# Patient Record
Sex: Female | Born: 1955 | Race: White | Hispanic: No | Marital: Married | Smoking: Former smoker
Health system: Southern US, Community
[De-identification: ages and names within clinical notes are randomized; demographics above are authoritative.]

## PROBLEM LIST (undated history)

## (undated) DIAGNOSIS — R7303 Prediabetes: Secondary | ICD-10-CM

## (undated) DIAGNOSIS — E785 Hyperlipidemia, unspecified: Secondary | ICD-10-CM

## (undated) DIAGNOSIS — M1990 Unspecified osteoarthritis, unspecified site: Secondary | ICD-10-CM

## (undated) DIAGNOSIS — E039 Hypothyroidism, unspecified: Secondary | ICD-10-CM

## (undated) DIAGNOSIS — I10 Essential (primary) hypertension: Secondary | ICD-10-CM

## (undated) DIAGNOSIS — N6320 Unspecified lump in the left breast, unspecified quadrant: Secondary | ICD-10-CM

## (undated) DIAGNOSIS — J449 Chronic obstructive pulmonary disease, unspecified: Secondary | ICD-10-CM

## (undated) HISTORY — PX: LUNG SURGERY: SHX703

## (undated) HISTORY — PX: CARPAL TUNNEL RELEASE: SHX101

---

## 2007-08-31 ENCOUNTER — Ambulatory Visit (HOSPITAL_COMMUNITY): Admission: RE | Admit: 2007-08-31 | Discharge: 2007-08-31 | Payer: Self-pay | Admitting: Internal Medicine

## 2007-09-17 ENCOUNTER — Ambulatory Visit: Payer: Self-pay | Admitting: Thoracic Surgery

## 2007-10-02 ENCOUNTER — Inpatient Hospital Stay (HOSPITAL_COMMUNITY): Admission: RE | Admit: 2007-10-02 | Discharge: 2007-10-06 | Payer: Self-pay | Admitting: Thoracic Surgery

## 2007-10-02 ENCOUNTER — Encounter: Payer: Self-pay | Admitting: Thoracic Surgery

## 2007-10-06 ENCOUNTER — Ambulatory Visit: Payer: Self-pay | Admitting: Thoracic Surgery

## 2007-10-14 ENCOUNTER — Encounter: Admission: RE | Admit: 2007-10-14 | Discharge: 2007-10-14 | Payer: Self-pay | Admitting: Thoracic Surgery

## 2007-10-14 ENCOUNTER — Ambulatory Visit: Payer: Self-pay | Admitting: Thoracic Surgery

## 2007-11-04 ENCOUNTER — Ambulatory Visit: Payer: Self-pay | Admitting: Thoracic Surgery

## 2007-11-04 ENCOUNTER — Encounter: Admission: RE | Admit: 2007-11-04 | Discharge: 2007-11-04 | Payer: Self-pay | Admitting: Thoracic Surgery

## 2007-11-18 ENCOUNTER — Encounter: Admission: RE | Admit: 2007-11-18 | Discharge: 2007-11-18 | Payer: Self-pay | Admitting: Thoracic Surgery

## 2007-11-18 ENCOUNTER — Ambulatory Visit: Payer: Self-pay | Admitting: Thoracic Surgery

## 2007-11-24 ENCOUNTER — Ambulatory Visit: Payer: Self-pay | Admitting: Cardiology

## 2008-01-12 ENCOUNTER — Encounter: Admission: RE | Admit: 2008-01-12 | Discharge: 2008-01-12 | Payer: Self-pay | Admitting: Thoracic Surgery

## 2008-01-12 ENCOUNTER — Ambulatory Visit: Payer: Self-pay | Admitting: Thoracic Surgery

## 2008-05-03 ENCOUNTER — Encounter: Admission: RE | Admit: 2008-05-03 | Discharge: 2008-05-03 | Payer: Self-pay | Admitting: Thoracic Surgery

## 2008-05-03 ENCOUNTER — Ambulatory Visit: Payer: Self-pay | Admitting: Thoracic Surgery

## 2008-11-08 ENCOUNTER — Ambulatory Visit: Payer: Self-pay | Admitting: Thoracic Surgery

## 2008-11-11 ENCOUNTER — Encounter: Payer: Self-pay | Admitting: Thoracic Surgery

## 2008-11-11 ENCOUNTER — Inpatient Hospital Stay (HOSPITAL_COMMUNITY): Admission: RE | Admit: 2008-11-11 | Discharge: 2008-11-17 | Payer: Self-pay | Admitting: Thoracic Surgery

## 2008-11-11 ENCOUNTER — Ambulatory Visit: Payer: Self-pay | Admitting: Infectious Diseases

## 2008-11-11 ENCOUNTER — Ambulatory Visit: Payer: Self-pay | Admitting: Thoracic Surgery

## 2008-11-24 ENCOUNTER — Encounter: Admission: RE | Admit: 2008-11-24 | Discharge: 2008-11-24 | Payer: Self-pay | Admitting: Thoracic Surgery

## 2008-11-24 ENCOUNTER — Ambulatory Visit: Payer: Self-pay | Admitting: Thoracic Surgery

## 2008-12-14 ENCOUNTER — Encounter: Admission: RE | Admit: 2008-12-14 | Discharge: 2008-12-14 | Payer: Self-pay | Admitting: Thoracic Surgery

## 2008-12-14 ENCOUNTER — Ambulatory Visit: Payer: Self-pay | Admitting: Thoracic Surgery

## 2009-02-21 ENCOUNTER — Ambulatory Visit: Payer: Self-pay | Admitting: Thoracic Surgery

## 2009-02-21 ENCOUNTER — Encounter: Admission: RE | Admit: 2009-02-21 | Discharge: 2009-02-21 | Payer: Self-pay | Admitting: Thoracic Surgery

## 2010-11-18 ENCOUNTER — Encounter: Payer: Self-pay | Admitting: Thoracic Surgery

## 2011-02-11 LAB — GLUCOSE, CAPILLARY
Glucose-Capillary: 102 mg/dL — ABNORMAL HIGH (ref 70–99)
Glucose-Capillary: 120 mg/dL — ABNORMAL HIGH (ref 70–99)
Glucose-Capillary: 129 mg/dL — ABNORMAL HIGH (ref 70–99)
Glucose-Capillary: 225 mg/dL — ABNORMAL HIGH (ref 70–99)
Glucose-Capillary: 85 mg/dL (ref 70–99)
Glucose-Capillary: 98 mg/dL (ref 70–99)
Glucose-Capillary: 99 mg/dL (ref 70–99)

## 2011-02-11 LAB — LACTATE DEHYDROGENASE, PLEURAL OR PERITONEAL FLUID: LD, Fluid: 3566 U/L — ABNORMAL HIGH (ref 3–23)

## 2011-02-11 LAB — BODY FLUID CELL COUNT WITH DIFFERENTIAL

## 2011-02-11 LAB — CBC
HCT: 26.8 % — ABNORMAL LOW (ref 36.0–46.0)
HCT: 29.1 % — ABNORMAL LOW (ref 36.0–46.0)
Hemoglobin: 10.1 g/dL — ABNORMAL LOW (ref 12.0–15.0)
Hemoglobin: 9.6 g/dL — ABNORMAL LOW (ref 12.0–15.0)
MCHC: 32.7 g/dL (ref 30.0–36.0)
MCHC: 32.6 g/dL (ref 30.0–36.0)
MCHC: 32.6 g/dL (ref 30.0–36.0)
MCHC: 32.9 g/dL (ref 30.0–36.0)
MCHC: 32.9 g/dL (ref 30.0–36.0)
MCV: 85.6 fL (ref 78.0–100.0)
Platelets: 254 10*3/uL (ref 150–400)
Platelets: 267 10*3/uL (ref 150–400)
Platelets: 287 10*3/uL (ref 150–400)
Platelets: 292 10*3/uL (ref 150–400)
RBC: 4.16 MIL/uL (ref 3.87–5.11)
RBC: 3.08 MIL/uL — ABNORMAL LOW (ref 3.87–5.11)
RBC: 3.38 MIL/uL — ABNORMAL LOW (ref 3.87–5.11)
RBC: 3.51 MIL/uL — ABNORMAL LOW (ref 3.87–5.11)
RDW: 15.3 % (ref 11.5–15.5)
RDW: 15.9 % — ABNORMAL HIGH (ref 11.5–15.5)
RDW: 15.9 % — ABNORMAL HIGH (ref 11.5–15.5)
RDW: 16.1 % — ABNORMAL HIGH (ref 11.5–15.5)
WBC: 11.6 10*3/uL — ABNORMAL HIGH (ref 4.0–10.5)
WBC: 4.9 10*3/uL (ref 4.0–10.5)
WBC: 7 10*3/uL (ref 4.0–10.5)
WBC: 8.3 10*3/uL (ref 4.0–10.5)

## 2011-02-11 LAB — BASIC METABOLIC PANEL
BUN: 4 mg/dL — ABNORMAL LOW (ref 6–23)
CO2: 27 mEq/L (ref 19–32)
CO2: 28 mEq/L (ref 19–32)
CO2: 29 mEq/L (ref 19–32)
Calcium: 9.3 mg/dL (ref 8.4–10.5)
Calcium: 7.6 mg/dL — ABNORMAL LOW (ref 8.4–10.5)
Calcium: 7.8 mg/dL — ABNORMAL LOW (ref 8.4–10.5)
Calcium: 8.3 mg/dL — ABNORMAL LOW (ref 8.4–10.5)
Calcium: 8.5 mg/dL (ref 8.4–10.5)
Chloride: 102 mEq/L (ref 96–112)
Chloride: 97 mEq/L (ref 96–112)
Creatinine, Ser: 0.49 mg/dL (ref 0.4–1.2)
Creatinine, Ser: 0.51 mg/dL (ref 0.4–1.2)
GFR calc Af Amer: >60 mL/min (ref >60)
GFR calc Af Amer: >60 mL/min (ref >60)
GFR calc Af Amer: >60 mL/min (ref >60)
GFR calc Af Amer: >60 mL/min (ref >60)
GFR calc non Af Amer: >60 mL/min (ref >60)
GFR calc non Af Amer: >60 mL/min (ref >60)
GFR calc non Af Amer: >60 mL/min (ref >60)
Glucose, Bld: 103 mg/dL — ABNORMAL HIGH (ref 70–99)
Glucose, Bld: 83 mg/dL (ref 70–99)
Glucose, Bld: 85 mg/dL (ref 70–99)
Potassium: 3.4 mEq/L — ABNORMAL LOW (ref 3.5–5.1)
Potassium: 3.6 mEq/L (ref 3.5–5.1)
Potassium: 4.2 mEq/L (ref 3.5–5.1)
Sodium: 132 mEq/L — ABNORMAL LOW (ref 135–145)
Sodium: 133 mEq/L — ABNORMAL LOW (ref 135–145)
Sodium: 136 mEq/L (ref 135–145)
Sodium: 138 mEq/L (ref 135–145)
Sodium: 139 mEq/L (ref 135–145)

## 2011-02-11 LAB — COMPREHENSIVE METABOLIC PANEL
ALT: 13 U/L (ref 0–35)
AST: 14 U/L (ref 0–37)
Calcium: 7.9 mg/dL — ABNORMAL LOW (ref 8.4–10.5)
Creatinine, Ser: 0.63 mg/dL (ref 0.4–1.2)
GFR calc Af Amer: >60 mL/min (ref >60)
Sodium: 132 mEq/L — ABNORMAL LOW (ref 135–145)
Total Protein: 5.7 g/dL — ABNORMAL LOW (ref 6.0–8.3)

## 2011-02-11 LAB — POCT I-STAT 3, ART BLOOD GAS (G3+)
Bicarbonate: 25.2 mEq/L — ABNORMAL HIGH (ref 20.0–24.0)
O2 Saturation: 92 %
TCO2: 26 mmol/L (ref 0–100)
pCO2 arterial: 41.5 mmHg (ref 35.0–45.0)
pO2, Arterial: 67 mmHg — ABNORMAL LOW (ref 80.0–100.0)

## 2011-02-11 LAB — BLOOD GAS, ARTERIAL
Drawn by: 206361
FIO2: 0.21 %
pCO2 arterial: 35 mmHg (ref 35.0–45.0)
pH, Arterial: 7.452 — ABNORMAL HIGH (ref 7.350–7.400)

## 2011-02-11 LAB — TISSUE CULTURE: Culture: NO GROWTH

## 2011-02-11 LAB — PROTIME-INR
INR: 1 (ref 0.00–1.49)
Prothrombin Time: 13.8 seconds (ref 11.6–15.2)

## 2011-02-11 LAB — AFB CULTURE WITH SMEAR (NOT AT ARMC)

## 2011-02-11 LAB — URINALYSIS, ROUTINE W REFLEX MICROSCOPIC
Hgb urine dipstick: NEGATIVE
Protein, ur: NEGATIVE mg/dL
Urobilinogen, UA: 0.2 mg/dL (ref 0.0–1.0)

## 2011-02-11 LAB — RHEUMATOID FACTOR: Rhuematoid fact SerPl-aCnc: 775 IU/mL — ABNORMAL HIGH (ref 0–20)

## 2011-02-11 LAB — BODY FLUID CULTURE: Culture: NO GROWTH

## 2011-02-11 LAB — FUNGUS CULTURE W SMEAR

## 2011-02-11 LAB — PROTEIN, BODY FLUID: Total protein, fluid: 5 g/dL

## 2011-02-11 LAB — GLUCOSE, SEROUS FLUID: Glucose, Fluid: <20 mg/dL

## 2011-02-11 LAB — TYPE AND SCREEN: ABO/RH(D): A POS

## 2011-02-11 LAB — ANAEROBIC CULTURE

## 2011-03-12 NOTE — Letter (Signed)
September 17, 2007   [name]  [address]  [place], [place] [address]   Re:  Stacey, THOMASSEN                  DOB:  [birth year]   Dear Dr. Olena Leatherwood:   I appreciate the opportunity to see [name].  This 55 year old  patient was found to have a right upper lobe nodule which was new in  nature.  It was 2.4 x 1.3 cm.  It was abutting the pleura.  She had a  pulmonary function test that showed an FVC of 2.2, an FEV1 of 1.64 and a  diffusion capacity of 64%.  She smokes 1/2 pack of cigarettes a day, is  trying to cut down.  She has had no hemoptysis, fever, chills or  excessive sputum.   PAST MEDICAL HISTORY:  She is significant for rheumatoid arthritis.   SHE IS ALLERGIC TO GUAIFENESIN.   She takes folic acid 1 mg a day, meloxicam 7.5 mg daily, prednisone 2.5  mg daily, methotrexate, Enbrel 50 mg once a week.  The Enbrel and the methotrexate have been stopped.   The rest of her past medical history is noncontributory.   FAMILY HISTORY:  She is negative for cancer or neurovascular disease.   SOCIAL HISTORY:  She is married.  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  She smokes 1/2 pack of cigarettes a day.  Does not drink  alcohol on a regular basis.   REVIEW OF SYSTEMS:  She is 165 pounds, she is 5 feet 5 inches.  CARDIAC:  No angina or atrial fibrillation.  GI:  No nausea, vomiting, constipation or diarrhea.  GU:  No kidney disease, dysuria or frequent urination.  VASCULAR:  No claudication, DVT, TIAs.  NEUROLOGICAL:  No headaches, blackout or seizures.  She has had severe  arthritis.  PSYCH:  She had no psychiatric illness.  No change in her eyesight or hearing.  No problems with bleeding or  clotting disorders.   PHYSICAL EXAM:  She is a well-developed Caucasian female in no acute  distress.  Head, eyes, ears, nose and throat:  Are unremarkable.  Neck:  Supple, without thyromegaly.  Chest:  Clear to auscultation and  percussion.  Heart:  Regular sinus  rhythm, no murmurs.  Abdomen:  Soft,  there is no hepatosplenomegaly.  Pulses are 2+.  There is no clubbing or  edema.  Extremities:  She has no deformities of the joints.  Her pulses  are 2+.  There is no clubbing or edema.  Neurologic:  She is oriented  x3.  Sensory and motor are intact.  Cranial nerves intact.   I think she has early lung cancer and I recommend that she get a VATS  lobectomy of her right upper lobe and we have tentatively scheduled this  in December.  I appreciate the opportunity of seeing [name].   Ines Bloomer, M.D.  Electronically Signed   DPB/MEDQ  D:  09/17/2007  T:  09/18/2007  Job:  [id number]

## 2011-03-12 NOTE — Letter (Signed)
December 14, 2008   Xaje A. Olena Leatherwood, MD  [address]  [place], [place] [address]   Re:  INETA, SINNING                  DOB:  1955/11/13   Dear Dr. Olena Leatherwood:   I saw the patient back today.  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  She still has a small reaction and small effusion, but really looks well  overall.  Her incisions are well healed.  Her blood pressure was 148/79,  pulse 100, respirations 16, and sats were 98%.  I will plan to see her  back again in 6 weeks with a chest x-ray and gave her a slip for return  to work in 4 weeks.   Ines Bloomer, M.D.  Electronically Signed   DPB/MEDQ  D:  12/14/2008  T:  12/14/2008  Job:  [id number]

## 2011-03-12 NOTE — Letter (Signed)
November 24, 2008   Dr. Annette Stable Hasanaj  [address]  [place], [place] [address]   Re:  [name], RUDDER                  DOB:  09/02/1956   Dear Dr. Olena Leatherwood:   The patient came in the office today.  We did a decortication, it turned  out that this was an intense inflammatory process.  It did not culture  out anything, but looked like an empyema although, we could not rule out  this was not secondary to rheumatoid lung.  Chest x-ray today showed her  incisions are well healed.  We removed her sutures.  Blood pressure was  139/82, pulse 100, respirations 20, and sats were 100%.  She will be  seen a rheumatologist today and I told her that, her chest x-ray showed  that her lungs are clear.  I will see her back again in 3 weeks with  another chest x-ray.   Sincerely,   Ines Bloomer, M.D.  Electronically Signed   DPB/MEDQ  D:  11/24/2008  T:  11/24/2008  Job:  [id number]

## 2011-03-12 NOTE — H&P (Signed)
[name], [name]          ACCOUNT NO.:  [id number]   MEDICAL RECORD NO.:  [id number]          PATIENT TYPE:  INP   LOCATION:  NA                           FACILITY:  [facility]   PHYSICIAN:  Ines Bloomer, M.D. DATE OF BIRTH:  04-03-1956   DATE OF ADMISSION:  DATE OF DISCHARGE:                              HISTORY & PHYSICAL   CHIEF COMPLAINT:  Left pleural effusion.   HISTORY OF PRESENT ILLNESS:  In December 2008, we wedged out a right  upper lobe lesion that was a necrotizing nodule.  The patient has a long  history of rheumatoid arthritis, and this was consistent with a  necrotizing granuloma.  All stains were negative.  This year, she  developed left pleural effusions, which she was admitted and readmitted  to [facility] in both September and October 2009, and had several  thoracentesis with recurrence of her pleural effusions.  She was seen in  consultation by Dr. Orson Aloe who recommended VATS biopsy possible and  talc pleurodesis of the left pleural effusion.   MEDICATIONS:  1. Folic acid.  2. Meloxicam 7.5 mg a day.  3. Prednisone 2.5 mg a day.  4. Methotrexate.  5. Enbrel.  6. Mucinex.   FAMILY HISTORY:  Noncontributory.   SOCIAL HISTORY:  Married, was a Psychologist, prison and probation services at [employer].  She quit smoking in November 2008.  Does not drink alcohol on a regular  basis.   REVIEW OF SYSTEMS:  VITAL SIGNS:  Weight is 180 pounds.  She is 5 feet  7.  She has had some weight gain.  CARDIAC:  No angina or atrial  fibrillation.  PULMONARY:  See history of present illness.  No  hemoptysis.  GI:  No nausea, vomiting, constipation, or diarrhea.  GU:  No kidney disease, dysuria, or frequent urination.  VASCULAR:  No  claudication, DVT, or TIAs.  NEUROLOGIC:  No dizziness, headaches,  blackouts, and seizures.  MUSCULOSKELETAL:  Rheumatoid arthritis.  See  history of present illness.  PSYCHIATRIC:  No depression or nervousness.  ENT:  No change in her  eyesight or hearing.  HEMATOLOGIC:  No problems  with bleeding, clotting disorders, or anemia.   PHYSICAL EXAMINATION:  GENERAL:  She is a well-developed Caucasian  female in no acute distress.  VITAL SIGNS:  Her blood pressure is 136/79, pulse 100, respirations 18,  and sats were 91%.  HEAD, EYES, EARS, NOSE and THROAT:  Unremarkable.  Head is atraumatic.  Eyes:  Pupils equal and reactive light and accommodation.  Extraocular  movements are normal.  Ears:  Tympanic membranes are intact.  Nose:  There is no septal deviation.  Throat without lesion.  NECK:  Supple without thyromegaly.  There is no supraclavicular or  axillary adenopathy.  CHEST:  Clear to auscultation and percussion.  HEART:  Regular sinus rhythm, no murmurs.  There is some decreased  breath sounds and dullness to percussion in the left lower lobe.  ABDOMEN:  Obese.  The bowel sounds are normal.  There is no  hepatosplenomegaly.  EXTREMITIES:  Pulses 2+.  There is no clubbing or edema.  NEUROLOGIC:  She  is oriented x3.  Sensory and motor intact.  Cranial  nerves intact.   IMPRESSION:  Rheumatoid arthritis; status post wedge resection of  necrotizing granulomas, right upper lobe;  loculated left pleural  effusion with thickening.   PLAN:  Left VATS, drainage of pleural effusion, and possible talc  pleurodesis.      Ines Bloomer, M.D.  Electronically Signed     DPB/MEDQ  D:  11/09/2008  T:  11/10/2008  Job:  [id number]

## 2011-03-12 NOTE — H&P (Signed)
NAME:  [name], [name]          ACCOUNT NO.:  [id number]   MEDICAL RECORD NO.:  [id number]          PATIENT TYPE:  INP   LOCATION:  NA                           FACILITY:  [facility]   PHYSICIAN:  Ines Bloomer, M.D. DATE OF BIRTH:  September 10, 1956   DATE OF ADMISSION:  DATE OF DISCHARGE:                              HISTORY & PHYSICAL   CHIEF COMPLAINT:  Right upper lobe nodule.   HISTORY OF PRESENT ILLNESS:  This 55 year old was found to have a right  upper lobe nodule that was 2.4 x 1.3 and it is near the pleura.  Pulmonary function tests showed 2.2 FVC and FEV-1 of 1.64 and a  diffusing capacity of 64%.  She smokes a half pack a day, is trying to  cut back.  She had no hemoptysis, fevers, chills or excessive sputum.  A  PET scan was done, which showed the right upper lobe nodule to be in the  malignant range with the maximum issue being 3.1.  Her lymph nodes were  negative.  She is referred here for resection.   ALLERGIES:  GUAIFENESIN.   PAST MEDICAL HISTORY:  She has severe rheumatoid arthritis.   MEDICATIONS:  She has been on Enbrel and methotrexate, which have been  stopped.  She is on prednisone 2.5 mg a day and Alaxin 7.5 mg a day, as  well as folic acid.   FAMILY HISTORY:  Negative for cancer or cardiovascular disease.   SOCIAL HISTORY:  She is married.  She is a Merchandiser, retail in [employer].  She  smokes a half pack of cigarettes a day.  She does not drink alcohol on a  regular basis.   REVIEW OF SYSTEMS:  She is a 165 pounds, she is 5 feet 4 inches.  CARDIAC:  No angio or atrial fibrillation.  GI:  No nausea, vomiting,  constipation, diarrhea or gastroesophageal reflux disease.  GU:  No  kidney disease, dysuria or frequent urination.  VASCULAR:  No  claudication, DVTs or TIAs.  NEUROLOGIC:  No headaches, blackouts or  seizures.  MUSCULOSKELETAL:  She has severe arthritis.  PSYCHIATRIC:  No  nervousness or depression.  EYE/ENT:  No change in her eyesight or  hearing.   HEMATOLOGICAL:  No problems with bleeding or clotting  disorders.   PHYSICAL EXAMINATION:  GENERAL:  She is a well-developed, Caucasian  female in no acute distress.  VITAL SIGNS:  Blood pressure is 157/78.  Pulse 95.  Respirations 18.  Saturations are 98%.  HEENT:  Head is atraumatic.  Eyes:  Pupils are equal and reactive to  light and accommodation.  Extraocular movements are normal.  Ears:  Tympanic membranes are intact.  Nose:  There is no septal deviation.  Throat is without lesion.  NECK:  Supple without thyromegaly.  There is no supraclavicular or  axillary adenopathy.  No jugular venous distention.  CHEST:  Clear to auscultation and percussion.  HEART:  Regular sinus rhythm.  No murmurs.  ABDOMEN:  Soft.  There is no hepatosplenomegaly.  EXTREMITIES:  Pulses are 2+.  There is no clubbing or edema.  NEUROLOGICAL:  She is oriented x3.  Sensory and motor are intact.  Cranial nerves are intact.  SKIN:  Without lesion.   IMPRESSION:  1. Right upper lobe nodule.  2. Tobacco abuse.  3. Severe rheumatoid arthritis.   PLAN:  Right VATs, possible right upper lobectomy.      Ines Bloomer, M.D.  Electronically Signed     DPB/MEDQ  D:  09/30/2007  T:  09/30/2007  Job:  [id number]

## 2011-03-12 NOTE — Discharge Summary (Signed)
[name], [name]          ACCOUNT NO.:  [id number]   MEDICAL RECORD NO.:  [id number]          PATIENT TYPE:  INP   LOCATION:  2039                         FACILITY:  [facility]   PHYSICIAN:  Ines Bloomer, M.D. DATE OF BIRTH:  June 07, 1956   DATE OF ADMISSION:  10/02/2007  DATE OF DISCHARGE:                               DISCHARGE SUMMARY   DATE OF ANTICIPATED DISCHARGE:  October 06, 2007.   PRIMARY ADMITTING DIAGNOSIS:  Right upper lobe nodule.   ADDITIONAL/DISCHARGE DIAGNOSES:  1. Necrotic right upper lobe nodule.  2. Severe rheumatoid arthritis.  3. History of tobacco abuse.   PROCEDURE PERFORMED:  Right video-assisted thoracoscopic surgery, right  mini thoracotomy with right upper lobe wedge resection.   HISTORY:  The patient is a 55 year old female who was recently referred  to Dr. Edwyna Shell for evaluation of a right upper lobe lung nodule.  By CT  scan this measured 2.4 x 1.3 cm and is located near the pleura.  She had  a PET scan, which showed increased uptake in the right upper lobe nodule  with an SUV of 3.1 and negative lymph nodes.  Dr. Edwyna Shell felt that she  should undergo a VATS wedge resection versus lobectomy at this time.  He  explained the risks, benefits and alternatives of the procedure to the  patient and she agreed to proceed.   HOSPITAL COURSE:  She was admitted to [facility] on October 02, 2007, and underwent a right upper lobe wedge resection.  Intraoperative  frozen section and final pathology were positive for a necrotic right  upper lobe nodule with an inflammatory component but no evidence of  malignancy.  She tolerated the procedure well and was transferred to the  step-down unit in stable condition.  Postoperatively she has done very  well.  Her chest tubes have been removed in the usual fashion and her  follow-up chest x-ray has remained stable with no pneumothorax.  Her  lines and hemodynamic monitoring devices have been removed.  She  has  been ambulating in the halls without problem.  She is tolerating a  regular diet and is having normal bowel and bladder function.  She has  been afebrile and all vital signs are stable.  Her O2 saturations have  been greater than 90% on room air.  Her incisions are all healing well.  She has been transferred to the floor at this time.  She will undergo a  PA and lateral chest x-ray on morning of October 06, 2007, and if this  has remained stable and she is otherwise doing well, she will hopefully  be ready for discharge home at that time.  Her most recent postoperative  labs show sodium 139, potassium 3.8, which has been supplemented, BUN 6,  creatinine 0.53.  Hemoglobin 9.9, hematocrit 29.2, white count 10.9,  platelets 193.   DISCHARGE MEDICATIONS:  1. Tylox one to two q.4h. p.r.n. for pain.  2. Folic acid 1 mg daily.  3. Meloxicam 7.5 mg daily.  4. Prednisone 2.5 mg daily.   DISCHARGE INSTRUCTIONS:  She is asked to refrain from driving, heavy  lifting or strenuous activity.  She may continue ambulating daily and  using her incentive spirometer.  She may shower daily and clean her  incisions with soap and water.  She will continue her same preoperative  diet.   DISCHARGE FOLLOWUP:  She will see Dr. __________.  She may contact us in  the interim if she experiences problems or has questions.      Coral Ceo, P.A.      Ines Bloomer, M.D.  Electronically Signed    GC/MEDQ  D:  10/05/2007  T:  10/06/2007  Job:  [id number]   cc:   Lia Hopping

## 2011-03-12 NOTE — Assessment & Plan Note (Signed)
OFFICE VISIT   [name], SHARPLES  DOB:  1955-12-10                                        January 12, 2008  CHART #:  [id number]   The patient came back today and the area where we removed her right  upper lobe nodule just shows some area of thickening.  She apparently  was in the hospital in January and had some fluid drawn off and has  been stable since then.   Her blood pressure was 124/74, pulse 90, respirations 18, SATs were 97%.   I will plan to see her back again in 3 months with a chest x-ray for  final check.   Ines Bloomer, M.D.  Electronically Signed   DPB/MEDQ  D:  01/12/2008  T:  01/13/2008  Job:  [id number]

## 2011-03-12 NOTE — Op Note (Signed)
NAMEAYANE, DELANCEY          ACCOUNT NO.:  [id number]   MEDICAL RECORD NO.:  [id number]          PATIENT TYPE:  INP   LOCATION:  2550                         FACILITY:  [facility]   PHYSICIAN:  Ines Bloomer, M.D. DATE OF BIRTH:  20-Jan-1956   DATE OF PROCEDURE:  10/02/2007  DATE OF DISCHARGE:                               OPERATIVE REPORT   PREOPERATIVE DIAGNOSIS:  Right upper lobe mass, positive on positron  emission tomography.   POSTOPERATIVE DIAGNOSIS:  Right upper lobe mass, positive on positron  emission tomography.   OPERATION PERFORMED:  Right VATS resection of right upper lobe lesion.   SURGEON:  Ines Bloomer, M.D.   FIRST ASSISTANT:  Coral Ceo, PA-C.   ANESTHESIA:  General anesthesia.   After percutaneous insertion of all monitoring lines, the patient  underwent general anesthesia.  She was turned to the right lateral  thoracotomy position.  The patient had a right upper lobe lesion that  was positive on PET of 2 cm in size.  She was brought to the operating  room for excision.  After a dual-lumen tube had been inserted, the right  lung was deflated.  Two trocar sites were made in the anterior and  posterior axillary line at the seventh intercostal space. Two trocars  were inserted, and the lesion was easily seen near the fissure in the  right upper lobe, right between the major and the minor fissure near the  right lateral segment of the right middle lobe.  We decided to do a  wedge resection of this.  A third trocar were made in the anterior  axillary line at the fifth intercostal space, and through that a [place]  lung clamp was inserted to grasp the lesion, and then coming in with an  Autosuture stapler we resected that with three applications and put it  in an Endobag and sent it for frozen section, which revealed a necrotic  mass, probably inflammatory.  Two chest tubes were brought in through  the trocar sites and tied in place with 0 silk.  A  subpleural On-Q was  inserted in the usual fashion.  A Marcaine block was done in the usual  fashion.  Chest was closed with #1 Vicryls in the muscle layer and 3-0  Vicryls as a subcuticular stitch.  The patient tolerated the procedure  well and was returned to the recovery room in stable condition.      Ines Bloomer, M.D.  Electronically Signed     DPB/MEDQ  D:  10/02/2007  T:  10/02/2007  Job:  [id number]

## 2011-03-12 NOTE — Discharge Summary (Signed)
[name], [name]          ACCOUNT NO.:  [id number]   MEDICAL RECORD NO.:  [id number]          PATIENT TYPE:  INP   LOCATION:  3304                         FACILITY:  [facility]   PHYSICIAN:  Ines Bloomer, M.D. DATE OF BIRTH:  28-Apr-1956   DATE OF ADMISSION:  11/11/2008  DATE OF DISCHARGE:  11/17/2008                               DISCHARGE SUMMARY   ADMITTING DIAGNOSES:  1. Recurrent left pleural effusion (status post thoracentesis in      October 2009).  2. History of necrotic right upper lobe nodule (status post right      video-assisted thoracoscopic surgery, wedge resection of right      upper lobe lesion on October 02, 2007).  3. History of rheumatoid arthritis.   DISCHARGE DIAGNOSES:  1. Left empyema.  2. History of necrotic right upper lobe nodule (status post right      video-assisted thoracoscopic surgery, wedge resection of right      upper lobe lesion on October 02, 2007).  3. History of rheumatoid arthritis.   PROCEDURE:  Left video-assisted thoracoscopic surgery, left mini-  thoracotomy, drainage of empyema with decortication by Dr. Edwyna Shell on  November 11, 2008.   HISTORY OF PRESENT ILLNESS:  This is a 55 year old Caucasian female with  a history of rheumatoid arthritis who was found to have a right upper  lobe lesion in December 2008.  She underwent a right VATS and wedge  resection of this right upper lobe lesion.  This was found to be  necrotic nodule consistent with necrotizing granuloma, no malignancy was  seen.  The patient then developed a left pleural effusion.  She was  admitted to [facility].  She underwent a thoracentesis in  September 2009.  The patient had recurrence of this left pleural  effusion, again was admitted and underwent a thoracentesis in October  2009.  She was seen in the office by Dr. Edwyna Shell on November 08, 2008.  The patient was admitted to [facility] undergo the  aforementioned left lung surgery with Dr. Edwyna Shell on  November 11, 2008.   BRIEF HOSPITAL COURSE STAY:  The patient remained afebrile and  hemodynamically stable.  She was originally placed on Zosyn and  vancomycin postoperatively.  Infectious Disease consult was obtained.  Rheumatoid factor was elevated at 775.  AFB cultures were negative.  No  yeast or fungal elements were seen on the left pleural fluid culture.  Tissue culture showed no organism or growth for 3 days.  Vancomycin and  Zosyn were discontinued.  The patient was started on Avelox.  The  patient did not have a air leak from her chest tube postoperatively.  This was placed to suction on November 13, 2008.  Posterior chest tube  was removed on November 15, 2008.  Followup chest x-ray revealed no  pneumothorax, question as to whether or not the remaining chest tube  site hole was in the thorax, right lower lobe atelectasis and  obscuration of the left costal phrenic angle.  The patient was also  found to be anemic postoperatively (H and H 8.7 and 26.3 on November 15, 2008).  The patient was started on Nu-Iron.  Followup H and H was 9.2  and 28.1.  The patient continued to improve such that by currently on  postop day #5 she has no complaints.  She is ambulating several times  daily.  She is afebrile.  Vital signs are stable.  O2 sat is 91% on 1 L  nasal cannula.   PHYSICAL EXAMINATION:  CARDIOVASCULAR:  Regular rate and rhythm.  LUNGS:  Decreased breath sounds at the left base, otherwise clear.  Incisions are clean and dry.  Chest tube remains.  No air leak present.  This chest tube is going to be removed.  A followup x-ray will be  obtained and provided the patient remains afebrile and hemodynamically  stable, she will be discharged on November 17, 2008.   BRIEF LABORATORY STUDIES:  Last BMET done today showed the potassium to  be 3.5, this is being supplemented, BUN and creatinine 5 and 0.51  respectively.  CBC also done on this date, H and H 9.2 and 28.1, white  count of 3900,  and platelet count of 292,000.  Latest chest x-ray done  today again showed no pneumothorax after posterior chest tube removed,  question positioning of the remaining chest tube whether or not the side-  hole was outside the thorax, right lower lobe atelectasis, obscuration  of the left costophrenic angle.   DISCHARGE INSTRUCTIONS:  The patient is to remain on a regular diet.  She may shower.  She is not to lift or drive for 2 weeks.  She is to  continue with her breathing exercises daily.  She is to walk every day  and increase her frequency and duration as tolerated.  She is to use  soap and water on her wounds and she may let her wounds open to air.  Followup appointment with Dr. Edwyna Shell is scheduled for November 24, 2008  at 1:00 p.m.  Prior to this appointment, a chest x-ray will be obtained.   DISCHARGE MEDICATIONS:  1. Folic acid 1 mg p.o. daily.  2. Prednisone 2.5 p.o. daily.  3. Mucinex-D p.o. dosage and frequency as taken preoperatively.  4. Nu-Iron 150 mg p.o. daily.  5. Os-Cal 1 p.o. 2 times daily.  6. Avelox 400 mg 1 p.o. daily.  7. Tylox 1-2 p.o. q.4-6 h. as needed for pain.      [name], [place]      Ines Bloomer, M.D.  Electronically Signed    DZ/MEDQ  D:  11/16/2008  T:  11/16/2008  Job:  [id number]   cc:   Lia Hopping

## 2011-03-12 NOTE — Assessment & Plan Note (Signed)
OFFICE VISIT   KAYREN, HOLCK  DOB:  30-Sep-1956                                        February 21, 2009  CHART #:  [id number]   The patient came today and her chest x-ray is stable.  There is no  evidence of recurrence of her left pleural effusion that was an  exudative effusion.  Her blood pressure is 136/81, pulse 74,  respirations 18, and sats are 98%.  She is having some trouble with  short-term disability and that they said she had pre-existing disease,  which I told her that was not the case.  I will be happy to write a  letter about this if she needs.   Ines Bloomer, M.D.  Electronically Signed   DPB/MEDQ  D:  02/21/2009  T:  02/21/2009  Job:  [id number]

## 2011-03-12 NOTE — Op Note (Signed)
[name], [name]          ACCOUNT NO.:  [id number]   MEDICAL RECORD NO.:  [id number]          PATIENT TYPE:  INP   LOCATION:  2301                         FACILITY:  [facility]   PHYSICIAN:  Ines Bloomer, M.D. DATE OF BIRTH:  Jan 29, 1956   DATE OF PROCEDURE:  DATE OF DISCHARGE:                               OPERATIVE REPORT   PREOPERATIVE DIAGNOSES:  Rheumatoid arthritis, left pleural effusion.   POSTOPERATIVE DIAGNOSES:  Rheumatoid arthritis, empyema left chest.   OPERATION:  Left video-assisted thoracoscopic surgery, drainage of  empyema with decortication, mini thoracotomy surgery.   SURGEON:  Ines Bloomer, MD   FIRST ASSISTANT:  Rowe Clack, PA-C   PROCEDURE:  After percutaneous insertion of all monitor lines, the  patient underwent general anesthesia, dual-lumen tube was inserted, the  left lung was deflated.  The patient turned in the left lateral  thoracotomy position.  Two trocar sites were made in the anterior and  posterior axillary lines at the seventh intercostal space, and 0-8  degrees operative scope was inserted.  The patient was found to have an  empyema and fluid were taken.  Using [facility] ring forceps, the exudate  was removed and part of that was sent for culture and we then freed off  the lung medially, laterally, and superiorly, but it was stuck to the  diaphragm.  So, we decided to do a mini thoracotomy over the sixth  intercostal space partially dividing the latissimus and splitting the  serratus and putting a Tuffier in the sixth intercostal space.  With  that, we were able to free the left lower lobe off the diaphragm and  then lingula off the heart and the diaphragm.  The necrotic debris was  removed.  We removed part of the pleura and sent it for frozen section  and was shows to be inflammatory.  We then did a decortication of the  lingula and part of the left upper lobe just stripping off the thickened  pleura with forceps and Kittners  until we had removed all of the debris  of left upper lobe.  We then turned our attention to the left lower  lobe, which was almost completely covered with thick peel.  We removed  the peel first from the fissure and then from of the diaphragm and on  the medial basilar segment whereof removing it from the medial basilar  segment to remove it off the pericardium.  We then proceeded with a  decortication, removal of this peel from medial to laterally and  superiorly up to the superior segment.  After all of the peel have been  removed, we irrigated copiously and then left costophrenic angle, we  removed more exudate.  After all the exudate had been removed, we then  placed 3 chest tubes, two 28 chest tubes through the trocar sites, and a  right-angled chest tube between these through a separate stab wound,  these were sutured in place with 0 silk.  Two holes were drilled through  the seventh rib and then pericostal passed through the seventh rib and  around the anterior rib and the lung was  re-expanded and the sutures  were tied down.  Marcaine block was done in the usual  fashion.  A single On-Q was inserted in the usual fashion.  The chest  was closed in layers with #1 Vicryl, 2-0 Vicryl, subcutaneous tissue and  Dermabond for the skin.  The patient tolerated the procedure well and  was turned to the recovery room in stable condition.      Ines Bloomer, M.D.  Electronically Signed     DPB/MEDQ  D:  11/11/2008  T:  11/12/2008  Job:  [id number]

## 2011-03-12 NOTE — Assessment & Plan Note (Signed)
OFFICE VISIT   [name], [name]  DOB:  08/20/1956                                        May 03, 2008  CHART #:  [id number]   HISTORY:  The patient returns on May 03, 2008, for a followup office  visit.  She is status post right video-assisted thoracoscopy with  resection of a right upper lobe lesion for a necrotic nodule with  surrounding palisaded histiocytes consistent with necrotizing granuloma.  There was no malignancy identified.  She has been followed with routine  radiographs.  She was last seen in the office on January 12, 2008 by Dr.  Edwyna Shell.   A chest x-ray was obtained on May 03, 2008.  There are no new acute  findings.  There are some stable right upper lobe surgical changes.   PHYSICAL EXAMINATION:  VITAL SIGNS:  Blood pressure 142/86, pulse 100,  respirations 18, and oxygen saturation is 93% on room air.  GENERAL:  Well-developed, adult white female in no acute distress.  PULMONARY:  Clear breath sounds throughout.  CARDIAC:  Regular rate and rhythm.  Normal S1 and S2.  Incisions are  inspected, and they are well healed.   ASSESSMENT:  The patient continues to do well.  She has stopped smoking.  I encouraged her in her goal of long-term smoking cessation.  From our  regard, we can now follow her on a p.r.n. basis.   Rowe Clack, P.A.-C.   Sherryll Burger  D:  05/03/2008  T:  05/04/2008  Job:  [id number]

## 2011-03-12 NOTE — Letter (Signed)
October 14, 2007   [name]  [address]  [place], [place] [address]   [name], [name]                  DOB:  1956-08-29   Dear Dr. Olena Leatherwood:   I saw 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  No organisms were seen on  special stains.  She came in today, she is having some mild itching. I  removed her chest tube sutures.  Her blood pressure is 121/76, pulse 93,  respirations 18, sats were 96%.  Overall she is doing reasonably well.  I told her she could start driving. I will see her back in 3 weeks with  a chest x-ray.   Sincerely,   Ines Bloomer, M.D.  Electronically Signed   DPB/MEDQ  D:  10/14/2007  T:  10/15/2007  Job:  [id number]

## 2011-03-12 NOTE — Letter (Signed)
November 08, 2008   Lia Hopping, MD  [address]  [place], [place] [address]   Re:  [name], KOMATSU                  DOB:  08-Mar-1956   Dear Dr. Olena Leatherwood:   The patient returns today for a left pleural effusion. In December 2008,  we wedged out a right upper lobe necrotizing nodule.  She has a long  history of rheumatoid arthritis and thisfinding was thought to be  consistent with a necrotizing granuloma.  The stains were all negative.  She is being readmitted with a recurrent left pleural effusion to  [facility] at least 2 times in September and October 2009 and had  a recent thoracentesis for recurrent left pleural effusion.  She gets  the pain and shortness of breath with her effusion.  Her blood pressure  is 136/79, pulse 100, respirations 18, and sats were 92%.  Her right  chest incision is well healed.  Her lungs showed decreased breath sounds  on the left side.  CT scan also showed in September 2009 showed some  pleural thickening and a loculated effusion on the left side.  He is  referred here for treatment of her left pleural effusion.  We had a long  discussion of the cause of the possibilites  which include  cancer,infection, or an inflammatory condition such as rheumatoid  arthrithis.  We will plan to do a left VATS, drainage of her left  pleural effusion, multiple pleural biopsies, and decortication if needed  and we will probably insert talc at that time to obliterate the pleural.  The chest x-ray that we did in July 2009 showed no effusions, but this  is all a recent problem.  It possibly could be related to infection, but  again if it is we will do a decortication.  I appreciate the opportunity  of seeing the patient.  Her blood pressure is 136/79, pulse 100.   Sincerely.   Ines Bloomer, M.D.  Electronically Signed   DPB/MEDQ  D:  11/08/2008  T:  11/08/2008  Job:  [id number]

## 2011-03-15 NOTE — Assessment & Plan Note (Signed)
OFFICE VISIT   [name], [name]  DOB:  April 28, 1956                                        November 19, 2007  CHART #:  [id number]   [name] is in for follow up today. She is over a month since we did  a VATS resection of her right upper lobe lesion. Her incisions are well  healed. She is still having a lot of arthritic pain and and wants to go  back to her rheumatologist to decide whether to go back onEmbral or  methotrexate. Although she appears to be making slow but steady progress  and I will see her back again in six weeks for a follow up check.   Ines Bloomer, M.D.  Electronically Signed   DPB/MEDQ  D:  11/19/2007  T:  11/20/2007  Job:  [id number]

## 2011-03-15 NOTE — Letter (Signed)
November 04, 2007   [name]  [address]  [place], [place] [address]   Re:  [name], [name]                  DOB:  Mar 06, 1956   Dear Dr. Olena Leatherwood:   I saw [name] back after her thoracotomy. Her blood pressure is  140/70, pulse 91, respirations 18, saturations were 94%. Chest x-ray  showed normal postoperative changes. There is no growth from her right  upper lobe nodule. She is doing well overall and I plan to see her back  again in 6 weeks with a chest x-ray.   Ines Bloomer, M.D.  Electronically Signed   DPB/MEDQ  D:  11/04/2007  T:  11/04/2007  Job:  [id number]

## 2011-07-09 ENCOUNTER — Other Ambulatory Visit: Payer: Self-pay | Admitting: Internal Medicine

## 2011-08-05 LAB — CBC
HCT: 33.9 — ABNORMAL LOW
HCT: 38.1
Hemoglobin: 11.4 — ABNORMAL LOW
MCHC: 33.6
MCHC: 33.8
MCV: 88.4
Platelets: 193
Platelets: 227
RBC: 4.33
RDW: 13.7
RDW: 14.2
WBC: 8.6

## 2011-08-05 LAB — BLOOD GAS, ARTERIAL
Bicarbonate: 25.6 — ABNORMAL HIGH
Patient temperature: 98.6
TCO2: 26.9
TCO2: 27.5
pCO2 arterial: 38.8
pH, Arterial: 7.388
pH, Arterial: 7.445 — ABNORMAL HIGH
pO2, Arterial: 75.4 — ABNORMAL LOW

## 2011-08-05 LAB — BASIC METABOLIC PANEL
BUN: 6
CO2: 27
CO2: 30
Calcium: 8.8
Chloride: 102
Chloride: 106
Creatinine, Ser: 0.53
Glucose, Bld: 185 — ABNORMAL HIGH
Potassium: 3.8
Potassium: 4.2
Sodium: 140

## 2011-08-05 LAB — COMPREHENSIVE METABOLIC PANEL
ALT: 18
AST: 17
AST: 18
Albumin: 3.7
Alkaline Phosphatase: 78
BUN: 9
CO2: 23
Calcium: 7.6 — ABNORMAL LOW
Chloride: 105
GFR calc Af Amer: >60
GFR calc Af Amer: >60
Potassium: 4.2
Sodium: 140
Total Bilirubin: 0.5
Total Protein: 5.1 — ABNORMAL LOW

## 2011-08-05 LAB — FUNGUS CULTURE W SMEAR: Fungal Smear: NONE SEEN

## 2011-08-05 LAB — TYPE AND SCREEN: Antibody Screen: NEGATIVE

## 2011-08-05 LAB — URINALYSIS, ROUTINE W REFLEX MICROSCOPIC
Bilirubin Urine: NEGATIVE
Hgb urine dipstick: NEGATIVE
Specific Gravity, Urine: 1.017
pH: 7

## 2011-08-05 LAB — ABO/RH: ABO/RH(D): A POS

## 2011-08-05 LAB — TISSUE CULTURE

## 2011-08-05 LAB — AFB CULTURE WITH SMEAR (NOT AT ARMC)

## 2016-07-04 ENCOUNTER — Other Ambulatory Visit: Payer: Self-pay | Admitting: Internal Medicine

## 2016-07-04 DIAGNOSIS — R921 Mammographic calcification found on diagnostic imaging of breast: Secondary | ICD-10-CM

## 2016-07-04 DIAGNOSIS — N6489 Other specified disorders of breast: Secondary | ICD-10-CM

## 2016-08-06 ENCOUNTER — Ambulatory Visit
Admission: RE | Admit: 2016-08-06 | Discharge: 2016-08-06 | Disposition: A | Discharge status: Home / Self Care | Payer: BLUE CROSS/BLUE SHIELD | Source: Ambulatory Visit | Attending: Internal Medicine | Admitting: Internal Medicine

## 2016-08-06 ENCOUNTER — Other Ambulatory Visit: Payer: Self-pay | Admitting: Internal Medicine

## 2016-08-06 DIAGNOSIS — R921 Mammographic calcification found on diagnostic imaging of breast: Secondary | ICD-10-CM

## 2016-08-06 DIAGNOSIS — N6489 Other specified disorders of breast: Secondary | ICD-10-CM

## 2016-08-20 ENCOUNTER — Ambulatory Visit: Payer: Self-pay | Admitting: General Surgery

## 2016-08-20 DIAGNOSIS — D0502 Lobular carcinoma in situ of left breast: Secondary | ICD-10-CM

## 2016-08-22 ENCOUNTER — Ambulatory Visit: Payer: Self-pay | Admitting: General Surgery

## 2016-08-22 DIAGNOSIS — D0502 Lobular carcinoma in situ of left breast: Secondary | ICD-10-CM

## 2016-08-28 ENCOUNTER — Other Ambulatory Visit: Payer: Self-pay | Admitting: General Surgery

## 2016-08-28 DIAGNOSIS — D0502 Lobular carcinoma in situ of left breast: Secondary | ICD-10-CM

## 2016-09-09 ENCOUNTER — Encounter (HOSPITAL_BASED_OUTPATIENT_CLINIC_OR_DEPARTMENT_OTHER): Payer: Self-pay | Admitting: *Deleted

## 2016-09-11 ENCOUNTER — Other Ambulatory Visit: Payer: Self-pay

## 2016-09-11 ENCOUNTER — Encounter (HOSPITAL_BASED_OUTPATIENT_CLINIC_OR_DEPARTMENT_OTHER)
Admission: RE | Admit: 2016-09-11 | Discharge: 2016-09-11 | Disposition: A | Discharge status: Home / Self Care | Payer: BLUE CROSS/BLUE SHIELD | Source: Ambulatory Visit | Attending: General Surgery | Admitting: General Surgery

## 2016-09-11 DIAGNOSIS — Z01812 Encounter for preprocedural laboratory examination: Secondary | ICD-10-CM | POA: Diagnosis present

## 2016-09-11 DIAGNOSIS — Z01810 Encounter for preprocedural cardiovascular examination: Secondary | ICD-10-CM | POA: Insufficient documentation

## 2016-09-11 DIAGNOSIS — I10 Essential (primary) hypertension: Secondary | ICD-10-CM | POA: Diagnosis not present

## 2016-09-11 LAB — BASIC METABOLIC PANEL
Anion gap: 8 (ref 5–15)
BUN: 9 mg/dL (ref 6–20)
CO2: 28 mmol/L (ref 22–32)
CREATININE: 0.76 mg/dL (ref 0.44–1.00)
Calcium: 10 mg/dL (ref 8.9–10.3)
Chloride: 105 mmol/L (ref 101–111)
Glucose, Bld: 99 mg/dL (ref 65–99)
Potassium: 4.8 mmol/L (ref 3.5–5.1)
Sodium: 141 mmol/L (ref 135–145)

## 2016-09-11 NOTE — Progress Notes (Signed)
Pt here for PAT testing (blood work and EKG), Gave pt boost breeze per ERAS protocol with instruction to take her synthroid and metoprolol with her drink and finish it by 1030 on DOS. Pt verbalized understanding.

## 2016-09-12 ENCOUNTER — Ambulatory Visit
Admission: RE | Admit: 2016-09-12 | Discharge: 2016-09-12 | Disposition: A | Discharge status: Home / Self Care | Payer: BLUE CROSS/BLUE SHIELD | Source: Ambulatory Visit | Attending: General Surgery | Admitting: General Surgery

## 2016-09-12 DIAGNOSIS — D0502 Lobular carcinoma in situ of left breast: Secondary | ICD-10-CM

## 2016-09-16 ENCOUNTER — Ambulatory Visit (HOSPITAL_BASED_OUTPATIENT_CLINIC_OR_DEPARTMENT_OTHER): Payer: BLUE CROSS/BLUE SHIELD | Admitting: Certified Registered"

## 2016-09-16 ENCOUNTER — Encounter (HOSPITAL_BASED_OUTPATIENT_CLINIC_OR_DEPARTMENT_OTHER): Payer: Self-pay | Admitting: *Deleted

## 2016-09-16 ENCOUNTER — Encounter (HOSPITAL_BASED_OUTPATIENT_CLINIC_OR_DEPARTMENT_OTHER)
Admission: RE | Disposition: A | Discharge status: Home / Self Care | Payer: Self-pay | Source: Ambulatory Visit | Attending: General Surgery

## 2016-09-16 ENCOUNTER — Ambulatory Visit
Admission: RE | Admit: 2016-09-16 | Discharge: 2016-09-16 | Disposition: A | Discharge status: Home / Self Care | Payer: BLUE CROSS/BLUE SHIELD | Source: Ambulatory Visit | Attending: General Surgery | Admitting: General Surgery

## 2016-09-16 ENCOUNTER — Ambulatory Visit (HOSPITAL_COMMUNITY)
Admission: RE | Admit: 2016-09-16 | Discharge: 2016-09-16 | Disposition: A | Discharge status: Home / Self Care | Payer: BLUE CROSS/BLUE SHIELD | Source: Ambulatory Visit | Attending: General Surgery | Admitting: General Surgery

## 2016-09-16 DIAGNOSIS — N63 Unspecified lump in unspecified breast: Secondary | ICD-10-CM | POA: Diagnosis present

## 2016-09-16 DIAGNOSIS — D0502 Lobular carcinoma in situ of left breast: Secondary | ICD-10-CM | POA: Insufficient documentation

## 2016-09-16 DIAGNOSIS — M1990 Unspecified osteoarthritis, unspecified site: Secondary | ICD-10-CM | POA: Insufficient documentation

## 2016-09-16 DIAGNOSIS — N6022 Fibroadenosis of left breast: Secondary | ICD-10-CM | POA: Diagnosis not present

## 2016-09-16 DIAGNOSIS — J439 Emphysema, unspecified: Secondary | ICD-10-CM | POA: Insufficient documentation

## 2016-09-16 DIAGNOSIS — I10 Essential (primary) hypertension: Secondary | ICD-10-CM | POA: Diagnosis not present

## 2016-09-16 DIAGNOSIS — Z79899 Other long term (current) drug therapy: Secondary | ICD-10-CM | POA: Insufficient documentation

## 2016-09-16 DIAGNOSIS — E039 Hypothyroidism, unspecified: Secondary | ICD-10-CM | POA: Diagnosis not present

## 2016-09-16 DIAGNOSIS — E7800 Pure hypercholesterolemia, unspecified: Secondary | ICD-10-CM | POA: Diagnosis not present

## 2016-09-16 DIAGNOSIS — Z87891 Personal history of nicotine dependence: Secondary | ICD-10-CM | POA: Insufficient documentation

## 2016-09-16 HISTORY — DX: Chronic obstructive pulmonary disease, unspecified: J44.9

## 2016-09-16 HISTORY — DX: Essential (primary) hypertension: I10

## 2016-09-16 HISTORY — DX: Hyperlipidemia, unspecified: E78.5

## 2016-09-16 HISTORY — DX: Unspecified lump in the left breast, unspecified quadrant: N63.20

## 2016-09-16 HISTORY — PX: BREAST EXCISIONAL BIOPSY: SUR124

## 2016-09-16 HISTORY — DX: Hypothyroidism, unspecified: E03.9

## 2016-09-16 HISTORY — DX: Unspecified osteoarthritis, unspecified site: M19.90

## 2016-09-16 HISTORY — PX: BREAST LUMPECTOMY WITH RADIOACTIVE SEED LOCALIZATION: SHX6424

## 2016-09-16 SURGERY — BREAST LUMPECTOMY WITH RADIOACTIVE SEED LOCALIZATION
Anesthesia: General | Site: Breast | Laterality: Left

## 2016-09-16 MED ORDER — TRAMADOL HCL 50 MG PO TABS: 50.0000 mg | ORAL_TABLET | Freq: Four times a day (QID) | ORAL | 1 refills | Status: DC | PRN

## 2016-09-16 MED ORDER — FENTANYL CITRATE (PF) 100 MCG/2ML IJ SOLN
50.0000 ug | INTRAMUSCULAR | Status: DC | PRN
  Administered 2016-09-16: 50 ug via INTRAVENOUS
  Administered 2016-09-16: 100 ug via INTRAVENOUS

## 2016-09-16 MED ORDER — DEXAMETHASONE SODIUM PHOSPHATE 4 MG/ML IJ SOLN
INTRAMUSCULAR | Status: DC | PRN
  Administered 2016-09-16: 10 mg via INTRAVENOUS

## 2016-09-16 MED ORDER — CEFAZOLIN SODIUM-DEXTROSE 2-4 GM/100ML-% IV SOLN
INTRAVENOUS | Status: AC
  Filled 2016-09-16: qty 100

## 2016-09-16 MED ORDER — CEFAZOLIN SODIUM-DEXTROSE 2-4 GM/100ML-% IV SOLN
2.0000 g | INTRAVENOUS | Status: AC
  Administered 2016-09-16: 2 g via INTRAVENOUS

## 2016-09-16 MED ORDER — CHLORHEXIDINE GLUCONATE CLOTH 2 % EX PADS: 6.0000 | MEDICATED_PAD | Freq: Once | CUTANEOUS | Status: DC

## 2016-09-16 MED ORDER — FENTANYL CITRATE (PF) 100 MCG/2ML IJ SOLN
INTRAMUSCULAR | Status: AC
  Filled 2016-09-16: qty 2

## 2016-09-16 MED ORDER — EPHEDRINE 5 MG/ML INJ
INTRAVENOUS | Status: AC
  Filled 2016-09-16: qty 10

## 2016-09-16 MED ORDER — MIDAZOLAM HCL 2 MG/2ML IJ SOLN
INTRAMUSCULAR | Status: AC
  Filled 2016-09-16: qty 2

## 2016-09-16 MED ORDER — BUPIVACAINE HCL (PF) 0.25 % IJ SOLN
INTRAMUSCULAR | Status: AC
  Filled 2016-09-16: qty 30

## 2016-09-16 MED ORDER — ONDANSETRON HCL 4 MG/2ML IJ SOLN: 4.0000 mg | Freq: Once | INTRAMUSCULAR | Status: DC | PRN

## 2016-09-16 MED ORDER — MIDAZOLAM HCL 2 MG/2ML IJ SOLN
1.0000 mg | INTRAMUSCULAR | Status: DC | PRN
  Administered 2016-09-16: 2 mg via INTRAVENOUS

## 2016-09-16 MED ORDER — PROPOFOL 10 MG/ML IV BOLUS
INTRAVENOUS | Status: DC | PRN
  Administered 2016-09-16: 200 mg via INTRAVENOUS

## 2016-09-16 MED ORDER — OXYCODONE-ACETAMINOPHEN 5-325 MG PO TABS: 1.0000 | ORAL_TABLET | ORAL | 0 refills | Status: DC | PRN

## 2016-09-16 MED ORDER — HYDROMORPHONE HCL 1 MG/ML IJ SOLN: 0.2500 mg | INTRAMUSCULAR | Status: DC | PRN

## 2016-09-16 MED ORDER — FENTANYL CITRATE (PF) 100 MCG/2ML IJ SOLN
INTRAMUSCULAR | Status: AC
Start: 2016-09-16 — End: 2016-09-16
  Filled 2016-09-16: qty 2

## 2016-09-16 MED ORDER — SCOPOLAMINE 1 MG/3DAYS TD PT72: 1.0000 | MEDICATED_PATCH | Freq: Once | TRANSDERMAL | Status: DC | PRN

## 2016-09-16 MED ORDER — BUPIVACAINE HCL (PF) 0.25 % IJ SOLN
INTRAMUSCULAR | Status: DC | PRN
  Administered 2016-09-16: 10 mL

## 2016-09-16 MED ORDER — LACTATED RINGERS IV SOLN
INTRAVENOUS | Status: DC
  Administered 2016-09-16: 10 mL/h via INTRAVENOUS
  Administered 2016-09-16: 16:00:00 via INTRAVENOUS

## 2016-09-16 MED ORDER — MEPERIDINE HCL 25 MG/ML IJ SOLN: 6.2500 mg | INTRAMUSCULAR | Status: DC | PRN

## 2016-09-16 MED ORDER — LIDOCAINE 2% (20 MG/ML) 5 ML SYRINGE
INTRAMUSCULAR | Status: DC | PRN
  Administered 2016-09-16: 100 mg via INTRAVENOUS

## 2016-09-16 SURGICAL SUPPLY — 42 items
APPLIER CLIP 9.375 MED OPEN (MISCELLANEOUS)
BLADE SURG 15 STRL LF DISP TIS (BLADE) ×1 IMPLANT
BLADE SURG 15 STRL SS (BLADE) ×1
CANISTER SUC SOCK COL 7IN (MISCELLANEOUS) IMPLANT
CANISTER SUCT 1200ML W/VALVE (MISCELLANEOUS) ×2 IMPLANT
CHLORAPREP W/TINT 26ML (MISCELLANEOUS) ×2 IMPLANT
CLIP APPLIE 9.375 MED OPEN (MISCELLANEOUS) IMPLANT
COVER BACK TABLE 60X90IN (DRAPES) ×2 IMPLANT
COVER MAYO STAND STRL (DRAPES) ×2 IMPLANT
COVER PROBE W GEL 5X96 (DRAPES) ×2 IMPLANT
DECANTER SPIKE VIAL GLASS SM (MISCELLANEOUS) ×2 IMPLANT
DERMABOND ADVANCED (GAUZE/BANDAGES/DRESSINGS) ×1
DERMABOND ADVANCED .7 DNX12 (GAUZE/BANDAGES/DRESSINGS) ×1 IMPLANT
DEVICE DUBIN W/COMP PLATE 8390 (MISCELLANEOUS) ×2 IMPLANT
DRAPE LAPAROSCOPIC ABDOMINAL (DRAPES) ×2 IMPLANT
DRAPE UTILITY XL STRL (DRAPES) ×2 IMPLANT
ELECT COATED BLADE 2.86 ST (ELECTRODE) ×2 IMPLANT
ELECT REM PT RETURN 9FT ADLT (ELECTROSURGICAL) ×2
ELECTRODE REM PT RTRN 9FT ADLT (ELECTROSURGICAL) ×1 IMPLANT
GLOVE BIO SURGEON STRL SZ7.5 (GLOVE) ×4 IMPLANT
GLOVE BIOGEL PI IND STRL 7.5 (GLOVE) ×2 IMPLANT
GLOVE BIOGEL PI INDICATOR 7.5 (GLOVE) ×2
GLOVE ECLIPSE 6.5 STRL STRAW (GLOVE) ×2 IMPLANT
GOWN STRL REUS W/ TWL LRG LVL3 (GOWN DISPOSABLE) ×2 IMPLANT
GOWN STRL REUS W/TWL LRG LVL3 (GOWN DISPOSABLE) ×2
ILLUMINATOR WAVEGUIDE N/F (MISCELLANEOUS) IMPLANT
KIT MARKER MARGIN INK (KITS) ×2 IMPLANT
LIGHT WAVEGUIDE WIDE FLAT (MISCELLANEOUS) IMPLANT
NEEDLE HYPO 25X1 1.5 SAFETY (NEEDLE) ×2 IMPLANT
NS IRRIG 1000ML POUR BTL (IV SOLUTION) ×2 IMPLANT
PACK BASIN DAY SURGERY FS (CUSTOM PROCEDURE TRAY) ×2 IMPLANT
PENCIL BUTTON HOLSTER BLD 10FT (ELECTRODE) ×2 IMPLANT
SLEEVE SCD COMPRESS KNEE MED (MISCELLANEOUS) ×2 IMPLANT
SPONGE LAP 18X18 X RAY DECT (DISPOSABLE) ×2 IMPLANT
SUT MON AB 4-0 PC3 18 (SUTURE) ×2 IMPLANT
SUT SILK 2 0 SH (SUTURE) IMPLANT
SUT VICRYL 3-0 CR8 SH (SUTURE) ×2 IMPLANT
SYR CONTROL 10ML LL (SYRINGE) ×2 IMPLANT
TOWEL OR 17X24 6PK STRL BLUE (TOWEL DISPOSABLE) ×2 IMPLANT
TOWEL OR NON WOVEN STRL DISP B (DISPOSABLE) ×2 IMPLANT
TUBE CONNECTING 20X1/4 (TUBING) ×2 IMPLANT
YANKAUER SUCT BULB TIP NO VENT (SUCTIONS) IMPLANT

## 2016-09-16 NOTE — Interval H&P Note (Signed)
History and Physical Interval Note:  09/16/2016 3:13 PM  [name]  has presented today for surgery, with the diagnosis of LEFT BREAST COMPLEX COMPLEX SCLEROSING LESION, AND LCIS  The various methods of treatment have been discussed with the patient and family. After consideration of risks, benefits and other options for treatment, the patient has consented to  Procedure([name]): LEFT BREAST LUMPECTOMY WITH RADIOACTIVE SEED LOCALIZATION X 2 (N/A) as a surgical intervention .  The patient'[name] history has been reviewed, patient examined, no change in status, stable for surgery.  I have reviewed the patient'[name] chart and labs.  Questions were answered to the patient'[name] satisfaction.     TOTH III,[name]

## 2016-09-16 NOTE — Discharge Instructions (Signed)

## 2016-09-16 NOTE — H&P (Signed)
[name]  Location: [facility] Patient #: [id number] DOB: [birth year] Married / Language: English / Race: White Female   History of Present Illness The patient is a 60 year old female who presents with a breast mass. We are asked to see the patient in consultation by Dr. Sherrie Sport to evaluate her for LCIS and a complex sclerosing lesion in the left breast. The patient is a 60 year old white female who recently went for a routine screening mammogram. At that time she was found to have 2 areas of distortion in the left breast. Both were biopsied. The upper lesion came back as lobular carcinoma in situ with a complex sclerosing lesion. The inner lesion came back as a complex sclerosing lesion. She tolerated the biopsy well. She denies any breast pain or discharge from the nipple. She has no personal or family history of breast cancer. She does have emphysema and quit smoking in 2008.   Other Problems  Arthritis Emphysema Of Lung High blood pressure Hypercholesterolemia Thyroid Disease  Past Surgical History  Breast Biopsy Left. Colon Polyp Removal - Colonoscopy Lung Surgery Bilateral.  Diagnostic Studies History  Colonoscopy within last year Mammogram within last year Pap Smear 1-5 years ago  Allergies  HYDROcodone Bitartrate *CHEMICALS* Itching, Swelling. GuaiFENesin *COUGH/COLD/ALLERGY*  Medication History Atorvastatin Calcium (20MG  Tablet, Oral) Active. Levothyroxine Sodium (50MCG Tablet, Oral) Active. Morrie Sheldon (5MG  Tablet, Oral) Active. Metoprolol Succinate ER (25MG  Tablet ER 24HR, Oral) Active. Medications Reconciled  Social History Caffeine use Carbonated beverages, Coffee, Tea. No alcohol use No drug use Tobacco use Former smoker.  Family History Cancer Father. Diabetes Mellitus Sister. Hypertension Mother.  Pregnancy / Birth History  Age at menarche 1 years. Age of menopause 26-55 Gravida 0 Para  0    Review of Systems  General Not Present- Appetite Loss, Chills, Fatigue, Fever, Night Sweats, Weight Gain and Weight Loss. Skin Not Present- Change in Wart/Mole, Dryness, Hives, Jaundice, New Lesions, Non-Healing Wounds, Rash and Ulcer. HEENT Present- Hearing Loss and Wears glasses/contact lenses. Not Present- Earache, Hoarseness, Nose Bleed, Oral Ulcers, Ringing in the Ears, Seasonal Allergies, Sinus Pain, Sore Throat, Visual Disturbances and Yellow Eyes. Respiratory Not Present- Bloody sputum, Chronic Cough, Difficulty Breathing, Snoring and Wheezing. Breast Not Present- Breast Mass, Breast Pain, Nipple Discharge and Skin Changes. Cardiovascular Not Present- Chest Pain, Difficulty Breathing Lying Down, Leg Cramps, Palpitations, Rapid Heart Rate, Shortness of Breath and Swelling of Extremities. Gastrointestinal Not Present- Abdominal Pain, Bloating, Bloody Stool, Change in Bowel Habits, Chronic diarrhea, Constipation, Difficulty Swallowing, Excessive gas, Gets full quickly at meals, Hemorrhoids, Indigestion, Nausea, Rectal Pain and Vomiting. Female Genitourinary Not Present- Frequency, Nocturia, Painful Urination, Pelvic Pain and Urgency. Musculoskeletal Present- Joint Pain. Not Present- Back Pain, Joint Stiffness, Muscle Pain, Muscle Weakness and Swelling of Extremities. Neurological Not Present- Decreased Memory, Fainting, Headaches, Numbness, Seizures, Tingling, Tremor, Trouble walking and Weakness. Psychiatric Not Present- Anxiety, Bipolar, Change in Sleep Pattern, Depression, Fearful and Frequent crying. Endocrine Not Present- Cold Intolerance, Excessive Hunger, Hair Changes, Heat Intolerance, Hot flashes and New Diabetes. Hematology Not Present- Blood Thinners, Easy Bruising, Excessive bleeding, Gland problems, HIV and Persistent Infections.  Vitals Weight: 202.8 lb Height: 65.5in Body Surface Area: 2 m Body Mass Index: 33.23 kg/m  Temp.: 98.28F  Pulse: 52 (Regular)   BP: 138/72 (Sitting, Left Arm, Standard)       Physical Exam General Mental Status-Alert. General Appearance-Consistent with stated age. Hydration-Well hydrated. Voice-Normal.  Head and Neck Head-normocephalic, atraumatic with no lesions or palpable masses. Trachea-midline. Thyroid  Gland Characteristics - normal size and consistency.  Eye Eyeball - Bilateral-Extraocular movements intact. Sclera/Conjunctiva - Bilateral-No scleral icterus.  Chest and Lung Exam Chest and lung exam reveals -quiet, even and easy respiratory effort with no use of accessory muscles and on auscultation, normal breath sounds, no adventitious sounds and normal vocal resonance. Inspection Chest Wall - Normal. Back - normal.  Breast Note: She has symmetric inverted nipples bilaterally. There is no palpable mass in either breast. There is no palpable axillary, supraclavicular, or cervical lymphadenopathy.   Cardiovascular Cardiovascular examination reveals -normal heart sounds, regular rate and rhythm with no murmurs and normal pedal pulses bilaterally.  Abdomen Inspection Inspection of the abdomen reveals - No Hernias. Skin - Scar - no surgical scars. Palpation/Percussion Palpation and Percussion of the abdomen reveal - Soft, Non Tender, No Rebound tenderness, No Rigidity (guarding) and No hepatosplenomegaly. Auscultation Auscultation of the abdomen reveals - Bowel sounds normal.  Neurologic Neurologic evaluation reveals -alert and oriented x 3 with no impairment of recent or remote memory. Mental Status-Normal.  Musculoskeletal Normal Exam - Left-Upper Extremity Strength Normal and Lower Extremity Strength Normal. Normal Exam - Right-Upper Extremity Strength Normal and Lower Extremity Strength Normal.  Lymphatic Head & Neck  General Head & Neck Lymphatics: Bilateral - Description - Normal. Axillary  General Axillary Region: Bilateral - Description -  Normal. Tenderness - Non Tender. Femoral & Inguinal  Generalized Femoral & Inguinal Lymphatics: Bilateral - Description - Normal. Tenderness - Non Tender.    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 Both of these areas are considered high risk lesions and because of the distortion I would recommend that both of these areas be removed. She would also like to have this done. I have discussed with her in detail the risks and benefits of the operation to do this as well as some of the technical aspects and she understands and wishes to proceed. I will plan for a left breast wire or seed localization lumpectomies 2. Referred to Oncology, for evaluation and follow up (Oncology). Routine.

## 2016-09-16 NOTE — Anesthesia Procedure Notes (Signed)
Procedure Name: LMA Insertion Date/Time: 09/16/2016 3:26 PM Performed by: Nakiea Metzner D Pre-anesthesia Checklist: Patient identified, Emergency Drugs available, Suction available and Patient being monitored Patient Re-evaluated:Patient Re-evaluated prior to inductionOxygen Delivery Method: Circle system utilized Preoxygenation: Pre-oxygenation with 100% oxygen Intubation Type: IV induction Ventilation: Mask ventilation without difficulty LMA: LMA inserted LMA Size: 4.0 Number of attempts: 1 Airway Equipment and Method: Bite block Placement Confirmation: positive ETCO2 and breath sounds checked- equal and bilateral Tube secured with: Tape Dental Injury: Teeth and Oropharynx as per pre-operative assessment

## 2016-09-16 NOTE — Anesthesia Postprocedure Evaluation (Signed)
Anesthesia Post Note  Patient: [name]  Procedure(s) Performed: Procedure(s) (LRB): LEFT BREAST LUMPECTOMY WITH RADIOACTIVE SEED LOCALIZATION X 2 (Left)  Patient location during evaluation: PACU Anesthesia Type: General Level of consciousness: awake and alert Pain management: pain level controlled Vital Signs Assessment: post-procedure vital signs reviewed and stable Respiratory status: spontaneous breathing, nonlabored ventilation, respiratory function stable and patient connected to nasal cannula oxygen Cardiovascular status: blood pressure returned to baseline and stable Postop Assessment: no signs of nausea or vomiting Anesthetic complications: no    Last Vitals:  Vitals:   09/16/16 1652 09/16/16 1717  BP:  140/65  Pulse: 71 73  Resp: 16 18  Temp:  36.4 C    Last Pain:  Vitals:   09/16/16 1717  TempSrc:   PainSc: 2                  Simrat Kendrick DAVID

## 2016-09-16 NOTE — Transfer of Care (Signed)
Immediate Anesthesia Transfer of Care Note  Patient: [name]  Procedure(s) Performed: Procedure(s): LEFT BREAST LUMPECTOMY WITH RADIOACTIVE SEED LOCALIZATION X 2 (Left)  Patient Location: PACU  Anesthesia Type:General  Level of Consciousness: awake and alert   Airway & Oxygen Therapy: Patient Spontanous Breathing and Patient connected to face mask oxygen  Post-op Assessment: Report given to RN and Post -op Vital signs reviewed and stable  Post vital signs: Reviewed and stable  Last Vitals:  Vitals:   09/16/16 1143  BP: (!) 145/79  Pulse: 67  Resp: 18  Temp: 36.9 C    Last Pain:  Vitals:   09/16/16 1143  TempSrc: Oral         Complications: No apparent anesthesia complications

## 2016-09-16 NOTE — Op Note (Signed)
09/16/2016  4:21 PM  PATIENT:  [name]  60 y.o. female  PRE-OPERATIVE DIAGNOSIS:  LEFT BREAST COMPLEX COMPLEX SCLEROSING LESION AND LCIS  POST-OPERATIVE DIAGNOSIS:  LEFT BREAST COMPLEX COMPLEX SCLEROSING LESION AND LCIS  PROCEDURE:  Procedure(s): LEFT BREAST LUMPECTOMY WITH RADIOACTIVE SEED LOCALIZATION X 2 (Left)  SURGEON:  Surgeon(s) and Role:    * Jovita Kussmaul, MD - Primary  PHYSICIAN ASSISTANT:   ASSISTANTS: none   ANESTHESIA:   general  EBL:  Total I/O In: 1000 [I.V.:1000] Out: 11 [Blood:11]  BLOOD ADMINISTERED:none  DRAINS: none   LOCAL MEDICATIONS USED:  MARCAINE     SPECIMEN:  Source of Specimen:  left breast lumpectomy X 2, medial and lateral  DISPOSITION OF SPECIMEN:  PATHOLOGY  COUNTS:  YES  TOURNIQUET:  * No tourniquets in log *  DICTATION: .Dragon Dictation   After informed consent was obtained the patient was brought to the operating room and placed in the supine position on the operating room table. After adequate induction of general anesthesia the patient's left breast was prepped with ChloraPrep, allowed to dry, and draped in usual sterile manner. An appropriate timeout was performed. Previously 2 I-125 seeds were placed in the upper portion of the left breast to mark an area of a complex sclerosing lesion as well as an area of lobular carcinoma in situ. The neoprobe was said to I-125 in the area of radioactivity was readily identified in the upper portion of the left breast near the edge of the areola. 2 distinct areas could be appreciated one medial and one lateral. A curvilinear incision was made along the upper edge of the areola on the left breast with a 15 blade knife. The incision was carried through the skin and subcutaneous tissue sharply with the electrocautery. Attention was first turned to the more medial radioactive seed. The neoprobe was used to direct dissection towards the medial radioactive seed. Once this seed was approached  and a circular portion of breast tissue was excised sharply around the radioactive seed while checking the area of radioactivity frequently. Once the specimen was removed it was oriented with the appropriate paint colors. A specimen radiograph was obtained that showed the clip and seed to be near the center of the specimen. The specimen was then sent to pathology for further evaluation. Attention was then turned to the more lateral seed. Dissection was carried through the same incision towards the lateral seed as directed by the neoprobe. Once the lateral seed was approached in a circular portion of breast tissue was excised sharply around the radioactive seed. Once the specimen was removed it was oriented with the appropriate paint colors. The medial border of the specimen was contiguous with the lateral border of the previous specimen. A specimen radiograph was obtained that showed the clip and seed to be near the center of the specimen. The specimen was also sent pathology for further evaluation. Hemostasis was achieved using the Bovie electrocautery. The wound was irrigated with saline and infiltrated with quarter percent Marcaine. The deep layer of the wound was then closed with layers of interrupted 3-0 Vicryl stitches. The skin was then closed with interrupted 4-0 Monocryl subcuticular stitches. Dermabond dressings were applied. The patient tolerated the procedure well. At the end of the case all needle sponge and instrument counts were correct. The patient was then awakened and taken to recovery in stable condition.  PLAN OF CARE: Discharge to home after PACU  PATIENT DISPOSITION:  PACU - hemodynamically stable.  Delay start of Pharmacological VTE agent (>24hrs) due to surgical blood loss or risk of bleeding: not applicable  

## 2016-09-16 NOTE — Anesthesia Preprocedure Evaluation (Signed)
Anesthesia Evaluation  Patient identified by MRN, date of birth, ID band Patient awake    Reviewed: Allergy & Precautions, NPO status , Patient's Chart, lab work & pertinent test results  Airway Mallampati: I  TM Distance: >3 FB Neck ROM: Full    Dental   Pulmonary former smoker,    Pulmonary exam normal        Cardiovascular hypertension, Pt. on medications Normal cardiovascular exam     Neuro/Psych    GI/Hepatic   Endo/Other  Hypothyroidism   Renal/GU      Musculoskeletal   Abdominal   Peds  Hematology   Anesthesia Other Findings   Reproductive/Obstetrics                             Anesthesia Physical Anesthesia Plan  ASA: II  Anesthesia Plan: General   Post-op Pain Management:    Induction: Intravenous  Airway Management Planned: LMA  Additional Equipment:   Intra-op Plan:   Post-operative Plan: Extubation in OR  Informed Consent: I have reviewed the patients History and Physical, chart, labs and discussed the procedure including the risks, benefits and alternatives for the proposed anesthesia with the patient or authorized representative who has indicated his/her understanding and acceptance.     Plan Discussed with: CRNA and Surgeon  Anesthesia Plan Comments:         Anesthesia Quick Evaluation

## 2016-09-17 ENCOUNTER — Encounter (HOSPITAL_BASED_OUTPATIENT_CLINIC_OR_DEPARTMENT_OTHER): Payer: Self-pay | Admitting: General Surgery

## 2016-10-24 ENCOUNTER — Encounter (HOSPITAL_COMMUNITY): Payer: BLUE CROSS/BLUE SHIELD | Attending: Hematology & Oncology | Admitting: Hematology & Oncology

## 2016-10-24 ENCOUNTER — Encounter (HOSPITAL_COMMUNITY): Payer: Self-pay | Admitting: Hematology & Oncology

## 2016-10-24 VITALS — BP 134/69 | HR 63 | Temp 98.2°F | Resp 16 | Ht 65.5 in | Wt 205.2 lb

## 2016-10-24 DIAGNOSIS — M069 Rheumatoid arthritis, unspecified: Secondary | ICD-10-CM

## 2016-10-24 DIAGNOSIS — Z87891 Personal history of nicotine dependence: Secondary | ICD-10-CM

## 2016-10-24 DIAGNOSIS — D0502 Lobular carcinoma in situ of left breast: Secondary | ICD-10-CM | POA: Diagnosis not present

## 2016-10-24 DIAGNOSIS — D242 Benign neoplasm of left breast: Secondary | ICD-10-CM | POA: Diagnosis not present

## 2016-10-24 DIAGNOSIS — N6489 Other specified disorders of breast: Secondary | ICD-10-CM | POA: Diagnosis not present

## 2016-10-24 NOTE — Patient Instructions (Addendum)
[place] at [facility] Discharge Instructions  RECOMMENDATIONS MADE BY THE CONSULTANT AND ANY TEST RESULTS WILL BE SENT TO YOUR REFERRING PHYSICIAN.  You were seen today by Dr. Whitney Muse Follow up in 6 months  Thank you for choosing [place] at [facility] to provide your oncology and hematology care.  To afford each patient quality time with our provider, please arrive at least 15 minutes before your scheduled appointment time.    If you have a lab appointment with the [facility] please come in thru the  Main Entrance and check in at the main information desk  You need to re-schedule your appointment should you arrive 10 or more minutes late.  We strive to give you quality time with our providers, and arriving late affects you and other patients whose appointments are after yours.  Also, if you no show three or more times for appointments you may be dismissed from the clinic at the providers discretion.     Again, thank you for choosing [facility].  Our hope is that these requests will decrease the amount of time that you wait before being seen by our physicians.       _____________________________________________________________  Should you have questions after your visit to [facility], please contact our office at [phone] between the hours of 8:30 a.m. and 4:30 p.m.  Voicemails left after 4:30 p.m. will not be returned until the following business day.  For prescription refill requests, have your pharmacy contact our office.       Resources For Cancer Patients and their Caregivers ? American Cancer Society: Can assist with transportation, wigs, general needs, runs Look Good Feel Better.        727-733-6494 ? Cancer Care: Provides financial assistance, online support groups, medication/co-pay assistance.  1-800-813-HOPE 206-409-5745) ? [place] Assists [place] cancer  patients and their families through emotional , educational and financial support.  ([phone] [phone] ? [place] DSS Where to apply for food stamps, Medicaid and utility assistance. 813-260-8137 ? RCATS: Transportation to medical appointments. 8104269848 ? Social Security Administration: May apply for disability if have a Stage IV cancer. [phone] ? [employer], Disability and Transit Services: Assists with nutrition, care and transit needs. [place] Support Programs: @10RELATIVEDAYS @ > Cancer Support Group  2nd Tuesday of the month 1pm-2pm, Journey Room  > Creative Journey  3rd Tuesday of the month 1130am-1pm, Journey Room  > Look Good Feel Better  1st Wednesday of the month 10am-12 noon, Journey Room (Call [place] to register [phone])

## 2016-10-24 NOTE — Progress Notes (Signed)
Met with pt face to face.  Introduced myself and explain a little bit about my role as the patient navigator.  Pt given a card with all my information on it.  Told pt to call if they had any questions or concerns.  Pt verbalized understanding.   Pt will call met and let me know if she wants to take and AI therapy or not.  If not we will see the pt back in 6 months

## 2016-10-24 NOTE — Progress Notes (Signed)
[place]  CONSULT NOTE  Patient Care Team: Shelbie Ammons, MD as PCP - General (Internal Medicine)  CHIEF COMPLAINTS/PURPOSE OF CONSULTATION:  LCIS of the left breast   HISTORY OF PRESENTING ILLNESS:  [name] 60 y.o. female is here because of referral from Dr. Marlou Starks for LCIS of the left breast.   The patient presented for unilateral left diagnostic mammogram on 08/06/2016 after screening 2D mammogram showed possible architectural distortion and indeterminate calcifications in the subareolar left breast. Diagnostic mammogram showed architectural distortion in the upper left breast at middle depth. The distortion was separate from the previously identified indeterminate calcifications which are in the inner breast at middle depth.  Biopsy of the upper left breast at middle depth on 08/06/16 revealed lobular neoplasia (lobular carcinoma in situ) involving a complex sclerosing lesion with calcifications. Biopsy of the inner left breast at middle depth on the same day showed complex sclerosing lesion with calcifications.  The patient underwent left breast lumpectomy on 09/16/2016 with Dr. Marlou Starks. Final pathology of the left medial with seed #1 showed lobular neoplasia (lobular carcinoma in situ), radial scar, intraductal papilloma, and fibrocystic change with adenosis and calcifications. Final pathology of the left lateral with seed #2 showed lobular neoplasia (lobular carcinoma in situ), radial scar, and intraductal papilloma. The carcinoma cells are negative for e-cadherin and cytokeratin 5/6, supporting a lobular phenotype. Stains forcalponin, smooth muscle myosin, p63, and cytokeratin AE1/AE3 fail to highlight the presence of invasive carcinoma.   [name] presents to the [facility] today   In 2008, she had soreness in her right side and she kept getting pneumonia. A mass was found in her right lung, when it was removed it was found to be benign. In 2010, her  left lung was scraped and emphysema was found.  She denies any major issues post-surgery.   Her breathing is good. "I don't even use my machine".  She has rheumatoid arthritis.  She had a colonoscopy this year. Her PCP is Dr. Sheryle Hail and will be seeing him tomorrow.  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   MEDICAL HISTORY:  Past Medical History:  Diagnosis Date  . Arthritis    RA  . Breast mass, left   . COPD (chronic obstructive pulmonary disease) ([place])   . Hyperlipidemia   . Hypertension   . Hypothyroidism     SURGICAL HISTORY: Past Surgical History:  Procedure Laterality Date  . BREAST LUMPECTOMY WITH RADIOACTIVE SEED LOCALIZATION Left 09/16/2016   Procedure: LEFT BREAST LUMPECTOMY WITH RADIOACTIVE SEED LOCALIZATION X 2;  Surgeon: Autumn Messing III, MD;  Location: [facility];  Service: General;  Laterality: Left;  . CARPAL TUNNEL RELEASE Left   . LUNG SURGERY     2008-removed benign mass rt lung, 2010- scraping left lung for emphysema    SOCIAL HISTORY: Social History   Social History  . Marital status: Married    Spouse name: N/A  . Number of children: N/A  . Years of education: N/A   Occupational History  . Not on file.   Social History Main Topics  . Smoking status: Former Smoker    Packs/day: 1.00    Quit date: 09/10/2007  . Smokeless tobacco: Never Used  . Alcohol use No  . Drug use: No  . Sexual activity: Not on file   Other Topics Concern  . Not on file   Social History Narrative  .  No narrative on file   Married for 30 years 3 step children 12 grandchildren 4 great grandchildren Ex smoker, quit in 2008 when she was diagnosed with what was thought to be lung cancer ETOH, none. Chewing tobacco, none. She enjoys gardening flowers and watching television.  FAMILY HISTORY: History reviewed. No pertinent family history.  Mother living at 72 yo. Healthy Father  deceased when patient was 65 years-old. Cancer from [employer]. 3 sisters, all healthy. No family history of breast cancer.  ALLERGIES:  is allergic to codeine; hydrocodone; and mucinex [guaifenesin er].  MEDICATIONS:  Current Outpatient Prescriptions  Medication Sig Dispense Refill  . atorvastatin (LIPITOR) 20 MG tablet Take 20 mg by mouth daily.    [place] levothyroxine (SYNTHROID, LEVOTHROID) 50 MCG tablet Take 50 mcg by mouth daily before breakfast.    . metoprolol tartrate (LOPRESSOR) 25 MG tablet Take 25 mg by mouth 2 (two) times daily.    . Multiple Vitamin (MULTIVITAMIN WITH MINERALS) TABS tablet Take 1 tablet by mouth daily.    . Tofacitinib Citrate (XELJANZ) 5 MG TABS Take 1 tablet by mouth 2 (two) times daily.      No current facility-administered medications for this visit.     Review of Systems  Constitutional: Negative.   HENT: Negative.   Eyes: Negative.   Respiratory: Negative.   Cardiovascular: Negative.   Gastrointestinal: Negative.   Genitourinary: Negative.   Musculoskeletal: Negative.   Skin: Negative.   Neurological: Negative.   Endo/Heme/Allergies: Negative.   Psychiatric/Behavioral: Negative.   All other systems reviewed and are negative.  14 point ROS was done and is otherwise as detailed above or in HPI   PHYSICAL EXAMINATION: ECOG PERFORMANCE STATUS: 0 - Asymptomatic  Vitals:   10/24/16 1455  BP: 134/69  Pulse: 63  Resp: 16  Temp: 98.2 F (36.8 C)   Filed Weights   10/24/16 1455  Weight: 205 lb 3.2 oz (93.1 kg)    Physical Exam  Constitutional: She is oriented to person, place, and time and well-developed, well-nourished, and in no distress.  HENT:  Head: Normocephalic and atraumatic.  Mouth/Throat: Oropharynx is clear and moist.  Eyes: Conjunctivae and EOM are normal. Pupils are equal, round, and reactive to light.  Neck: Normal range of motion. Neck supple.  Cardiovascular: Normal rate, regular rhythm and normal heart sounds.     Pulmonary/Chest: Effort normal and breath sounds normal.  Abdominal: Soft. Bowel sounds are normal.  Musculoskeletal: Normal range of motion.  Neurological: She is alert and oriented to person, place, and time. Gait normal.  Skin: Skin is warm and dry.  Nursing note and vitals reviewed.   LABORATORY DATA:  I have reviewed the data as listed Lab Results  Component Value Date   WBC 8.3 11/17/2008   HGB 9.6 (L) 11/17/2008   HCT 29.1 (L) 11/17/2008   MCV 86.2 11/17/2008   PLT 312 11/17/2008   CMP     Component Value Date/Time   NA 141 09/11/2016 1503   K 4.8 09/11/2016 1503   CL 105 09/11/2016 1503   CO2 28 09/11/2016 1503   GLUCOSE 99 09/11/2016 1503   BUN 9 09/11/2016 1503   CREATININE 0.76 09/11/2016 1503   CALCIUM 10.0 09/11/2016 1503   PROT 5.7 (L) 11/13/2008 0401   ALBUMIN 2.0 (L) 11/13/2008 0401   AST 14 11/13/2008 0401   ALT 13 11/13/2008 0401   ALKPHOS 45 11/13/2008 0401   BILITOT 0.5 11/13/2008 0401   GFRNONAA >60 09/11/2016 1503  GFRAA >60 09/11/2016 1503   PATHOLOGY     RADIOGRAPHIC STUDIES: I have personally reviewed the radiological images as listed and agreed with the findings in the report. No results found. Study Result   CLINICAL DATA:  60 year old presenting with possible architectural distortion on a screening 2D mammogram. She also has indeterminate calcifications in the subareolar left breast for which stereotactic biopsy will be performed later today.  EXAM: 2D DIGITAL DIAGNOSTIC UNILATERAL LEFT MAMMOGRAM WITH CAD AND ADJUNCT TOMO  COMPARISON:  Mammography 07/03/2016, 06/24/2016. Prior imaging was performed at [facility].  ACR Breast Density Category c: The breast tissue is heterogeneously dense, which may obscure small masses.  FINDINGS: Standard 2D and tomosynthesis full field CC and MLO views of the left breast were obtained. The tomosynthesis images confirm subtle distortion in the upper breast at middle  depth in the area of concern on screening mammography. The distortion is separate from the 9 mm group of calcifications which are in the slightly inner breast at middle depth.  Mammographic images were processed with CAD.  IMPRESSION: Architectural distortion in the upper left breast at middle depth is confirmed on tomosynthesis images. The distortion is separate from the previously identified indeterminate calcifications which are in the inner breast at middle depth.  RECOMMENDATION: Stereotactic core needle biopsy of the architectural distortion in the left breast in addition to the previously identified calcifications.  The stereotactic needle biopsy of both lesions was performed subsequently and is reported separately.  I have discussed the findings and recommendations with the patient. Results were also provided in writing at the conclusion of the visit.  BI-RADS CATEGORY  4: Suspicious.   Electronically Signed   By: Evangeline Dakin M.D.   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 Results are noted above. Patient and I discussed her pathology in detail. Initially she did not understand why she had to follow-up with oncology.  We discussed that LCIS is a noninvasive lesion that arises from the lobules and terminal ducts of the breast. if classic LCIS is diagnosed on an excisional breast biopsy, no further surgery is required. The relative risk of developing an invasive cancer in women with LCIS is approximately 7- to 11-fold higher than for women without LCIS. The absolute risk is approximately 1 percent per year and appears to be lifelong.  Lifelong active breast cancer surveillance is required for patients with  LCIS. We perform clinical breast evaluations every six months and mammography annually. Although annual screening magnetic resonance imaging (MRI) cannot be justified by a  high-risk breast lesion per se, screening breast MRI is appropriate for those with atypical hyperplasia or LCIS and a ?20 to 25 percent lifetime risk of developing breast cancer calculated using BRCAPRO or a similar model based on family history, rather than the [place].  For patients diagnosed with atypical hyperplasia or LCIS, we suggest endocrine therapy as chemoprevention rather than observation We discussed this in detail today.  We reviewed TAMOXIFEN and therapy with Aromatase Inhibitors. We reviewed the NCCN guidelines. WE discussed the differences in risks/benefits and side effects between the two "classes" of medications.  She was strongly encouraged to consider chemoprevention.   We briefly discussed radial scars and papilloma's. I advised her that no other therapy is indicated for these "lesions."   The patient met with our patient navigator [place] today. She is currently uncertain if she wishes to take a pill.  She is agreeable to return visit with breast exam and ongoing close observation. She will call [facility] if she opts for chemoprevention and we will call her in a prescription and bring her back 4 weeks later to assess tolerance.   For now, she will tentatively return in 6 months for follow up and breast exam.   All questions were answered. The patient knows to call the clinic with any problems, questions or concerns.  This document serves as a record of services personally performed by Ancil Linsey, MD. It was created on her behalf by Arlyce Harman, a trained medical scribe. The creation of this record is based on the scribe's personal observations and the provider's statements to them. This document has been checked and approved by the attending provider.  I have reviewed the above documentation for accuracy and completeness and I agree with the above.  This note was electronically signed.    Molli Hazard, MD  10/24/2016 3:02 PM

## 2017-04-25 ENCOUNTER — Ambulatory Visit (HOSPITAL_COMMUNITY): Payer: BLUE CROSS/BLUE SHIELD | Admitting: Adult Health

## 2017-05-13 ENCOUNTER — Encounter (HOSPITAL_COMMUNITY): Payer: BLUE CROSS/BLUE SHIELD | Attending: Hematology & Oncology | Admitting: Adult Health

## 2017-05-13 ENCOUNTER — Encounter (HOSPITAL_COMMUNITY): Payer: Self-pay | Admitting: Adult Health

## 2017-05-13 VITALS — BP 131/62 | HR 52 | Resp 16 | Ht 65.5 in | Wt 203.0 lb

## 2017-05-13 DIAGNOSIS — D0502 Lobular carcinoma in situ of left breast: Secondary | ICD-10-CM

## 2017-05-13 NOTE — Patient Instructions (Signed)
[place] at [facility] Discharge Instructions  RECOMMENDATIONS MADE BY THE CONSULTANT AND ANY TEST RESULTS WILL BE SENT TO YOUR REFERRING PHYSICIAN.  You were seen today by Mike Craze NP. Your mammogram is due in October. Return in 9 months for follow up.  Thank you for choosing [place] at [facility] to provide your oncology and hematology care.  To afford each patient quality time with our provider, please arrive at least 15 minutes before your scheduled appointment time.    If you have a lab appointment with the [facility] please come in thru the  Main Entrance and check in at the main information desk  You need to re-schedule your appointment should you arrive 10 or more minutes late.  We strive to give you quality time with our providers, and arriving late affects you and other patients whose appointments are after yours.  Also, if you no show three or more times for appointments you may be dismissed from the clinic at the providers discretion.     Again, thank you for choosing [facility].  Our hope is that these requests will decrease the amount of time that you wait before being seen by our physicians.       _____________________________________________________________  Should you have questions after your visit to [facility], please contact our office at [phone] between the hours of 8:30 a.m. and 4:30 p.m.  Voicemails left after 4:30 p.m. will not be returned until the following business day.  For prescription refill requests, have your pharmacy contact our office.       Resources For Cancer Patients and their Caregivers ? American Cancer Society: Can assist with transportation, wigs, general needs, runs Look Good Feel Better.        540-557-0012 ? Cancer Care: Provides financial assistance, online support groups, medication/co-pay assistance.  1-800-813-HOPE 713-591-3807) ? [place] Assists [place] cancer patients and their families through emotional , educational and financial support.  714 613 9735 ? [place] DSS Where to apply for food stamps, Medicaid and utility assistance. 210 789 4131 ? RCATS: Transportation to medical appointments. 302 346 9377 ? Social Security Administration: May apply for disability if have a Stage IV cancer. ([phone] 339-807-5486 ? [employer], Disability and Transit Services: Assists with nutrition, care and transit needs. [place] Support Programs: @10RELATIVEDAYS @ > Cancer Support Group  2nd Tuesday of the month 1pm-2pm, Journey Room  > Creative Journey  3rd Tuesday of the month 1130am-1pm, Journey Room  > Look Good Feel Better  1st Wednesday of the month 10am-12 noon, Journey Room (Call [place] to register [phone])

## 2017-05-13 NOTE — Progress Notes (Signed)
[place], [place] [address]   CLINIC:  Medical Oncology/Hematology  PCP:  Shelbie Ammons, MD No address on file None   REASON FOR VISIT:  Follow-up for LCIS of left breast   CURRENT THERAPY: Surveillance     HISTORY OF PRESENT ILLNESS:  (From Dr. Donald Pore last note on 10/24/16)      INTERVAL HISTORY:  [name] 61 y.o. 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    Overall, she tells me she has been feeling very well. Appetite and energy levels are 100%.  She denies any new breast concerns. She performs self-breast exams and denies any changes.  At her last visit to the cancer center in 09/2016, Dr. Whitney Muse talked to her about chemoprevention with anti-estrogen therapy. She declined anti-estrogen therapy at that time.   She sees her PCP regularly. Reports that she is up-to-date with her colonoscopy (2017), pap smear (2018), and her vaccines.    She is largely without complaints today.    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 She is married and has 3 step-children, 11 grandchildren, and 5 great-grandchildren.  Denies any current tobacco use; previously smoked 1 ppd x 34 years (quit in 2008). Denies any alcohol use.     REVIEW OF SYSTEMS:  Review of Systems  Constitutional: Negative.  Negative for chills, fatigue and fever.  HENT:  Negative.  Negative for lump/mass and nosebleeds.   Eyes: Negative.   Respiratory: Negative.  Negative for cough and shortness of breath.   Cardiovascular: Negative.  Negative for chest pain and leg swelling.  Gastrointestinal: Negative.  Negative for abdominal pain, blood in stool, constipation, diarrhea, nausea and vomiting.  Endocrine: Negative.   Genitourinary: Negative.  Negative for dysuria and hematuria.   Musculoskeletal: Negative.  Negative  for arthralgias.  Skin: Negative.  Negative for rash.  Neurological: Negative.  Negative for dizziness and headaches.  Hematological: Negative.  Negative for adenopathy. Does not bruise/bleed easily.  Psychiatric/Behavioral: Negative.  Negative for depression and sleep disturbance. The patient is not nervous/anxious.      PAST MEDICAL/SURGICAL HISTORY:  Past Medical History:  Diagnosis Date  . Arthritis    RA  . Breast mass, left   . COPD (chronic obstructive pulmonary disease) ([place])   . Hyperlipidemia   . Hypertension   . Hypothyroidism    Past Surgical History:  Procedure Laterality Date  . BREAST LUMPECTOMY WITH RADIOACTIVE SEED LOCALIZATION Left 09/16/2016   Procedure: LEFT BREAST LUMPECTOMY WITH RADIOACTIVE SEED LOCALIZATION X 2;  Surgeon: Autumn Messing III, MD;  Location: [facility];  Service: General;  Laterality: Left;  . CARPAL TUNNEL RELEASE Left   . LUNG SURGERY     2008-removed benign mass rt lung, 2010- scraping left lung for emphysema     SOCIAL HISTORY:  Social History   Social History  . Marital status: Married    Spouse name: N/A  . Number of children: N/A  . Years of education: N/A   Occupational History  . Not on file.   Social History Main Topics  . Smoking status: Former Smoker    Packs/day: 1.00    Quit date: 09/10/2007  . Smokeless tobacco: Never Used  . Alcohol use No  . Drug use: No  . Sexual activity: Not on file   Other Topics Concern  .  Not on file   Social History Narrative  . No narrative on file    FAMILY HISTORY:  History reviewed. No pertinent family history.  CURRENT MEDICATIONS:  Outpatient Encounter Prescriptions as of 05/13/2017  Medication Sig  . atorvastatin (LIPITOR) 20 MG tablet Take 20 mg by mouth daily.  Marland Kitchen levothyroxine (SYNTHROID, LEVOTHROID) 50 MCG tablet Take 50 mcg by mouth daily before breakfast.  . metoprolol tartrate (LOPRESSOR) 25 MG tablet Take 25 mg by mouth 2 (two) times daily.  .  Multiple Vitamin (MULTIVITAMIN WITH MINERALS) TABS tablet Take 1 tablet by mouth daily.  . Tofacitinib Citrate (XELJANZ) 5 MG TABS Take 1 tablet by mouth 2 (two) times daily.    No facility-administered encounter medications on file as of 05/13/2017.     ALLERGIES:  Allergies  Allergen Reactions  . Codeine Swelling  . Hydrocodone Swelling  . Mucinex [Guaifenesin Er] Itching     PHYSICAL EXAM:  ECOG Performance status: 0 - Asymptomatic   Vitals:   05/13/17 0912  BP: 131/62  Pulse: (!) 52  Resp: 16   Filed Weights   05/13/17 0912  Weight: 203 lb (92.1 kg)    Physical Exam  Constitutional: She is oriented to person, place, and time and well-developed, well-nourished, and in no distress.  HENT:  Head: Normocephalic.  Mouth/Throat: Oropharynx is clear and moist. No oropharyngeal exudate.  Eyes: Pupils are equal, round, and reactive to light. Conjunctivae are normal. No scleral icterus.  Neck: Normal range of motion. Neck supple.  Cardiovascular: Normal rate, regular rhythm and normal heart sounds.   Pulmonary/Chest: Effort normal and breath sounds normal. No respiratory distress. She has no wheezes. She has no rales.    Abdominal: Soft. Bowel sounds are normal. There is no tenderness. There is no rebound and no guarding.  Musculoskeletal: Normal range of motion. She exhibits no edema.  Lymphadenopathy:    She has no cervical adenopathy.       Right: No supraclavicular adenopathy present.       Left: No supraclavicular adenopathy present.  Neurological: She is alert and oriented to person, place, and time. No cranial nerve deficit. Gait normal.  Skin: Skin is warm and dry. No rash noted.  Psychiatric: Mood, memory, affect and judgment normal.  Nursing note and vitals reviewed.    LABORATORY DATA:  I have reviewed the labs as listed.  CBC    Component Value Date/Time   WBC 8.3 11/17/2008 0400   RBC 3.38 (L) 11/17/2008 0400   HGB 9.6 (L) 11/17/2008 0400   HCT 29.1  (L) 11/17/2008 0400   PLT 312 11/17/2008 0400   MCV 86.2 11/17/2008 0400   MCHC 33.0 11/17/2008 0400   RDW 15.9 (H) 11/17/2008 0400   CMP Latest Ref Rng & Units 09/11/2016 11/17/2008 11/16/2008  Glucose 65 - 99 mg/dL 99 103(H) 83  BUN 6 - 20 mg/dL 9 5(L) 5(L)  Creatinine 0.44 - 1.00 mg/dL 0.76 0.51 0.51  Sodium 135 - 145 mmol/L 141 138 139  Potassium 3.5 - 5.1 mmol/L 4.8 4.2 3.5  Chloride 101 - 111 mmol/L 105 102 102  CO2 22 - 32 mmol/L 28 29 29   Calcium 8.9 - 10.3 mg/dL 10.0 8.5 8.3(L)  Total Protein 6.0 - 8.3 g/dL - - -  Total Bilirubin 0.3 - 1.2 mg/dL - - -  Alkaline Phos 39 - 117 U/L - - -  AST 0 - 37 U/L - - -  ALT 0 - 35 U/L - - -  PENDING LABS:    DIAGNOSTIC IMAGING:  *The following radiologic images and reports have been reviewed independently and agree with below findings.  Mammogram: 08/06/16    PATHOLOGY:  (L) lumpectomy surgical path: 09/16/16            ASSESSMENT & PLAN:   Left breast LCIS:  -Diagnosed in 07/2016. Treated with lumpectomy by Dr. Marlou Starks on 09/16/16.  -Reiterated that LCIS is considered a high-risk breast lesion, which places her at increased risk for invasive breast cancers in the future.  Again reviewed the role that anti-estrogen therapy would play in reducing this risk; she declines anti-estrogen therapy.  -Annual screening breast MRI could be considered for high-risk patients, however, only those patients who are identified as having a 20-25% lifetime risk of developing breast cancer calculated by the [facility] model (rather than the [facility], which should not be used to assess lifetime risk of breast cancer in patients with history of LCIS).  -Diagnostic mammogram due in 07/2017; orders placed today.  -Return to cancer center in 01/2018 so that she has either a clinical breast exam or mammogram every 6 months. We will plan to see the patient every April and October, with mammogram annually in October.  She is agreeable with this plan.     Health maintenance:  -Encouraged continued follow-up with PCP as directed.  She is reportedly up-to-date with her colonoscopy (2017), pap smear (2018), and her vaccines.   -Could consider possibility of lung cancer screening with low-dose CT chest for patient in the future given her 34 pack year history of smoking and cessation < 15 years.  Did not discuss today and will consider at subsequent follow-up.        Dispo:  -Diagnostic mammogram due in 07/2017; orders placed today.  -Return to cancer center in 01/2018 (6 months after upcoming mammogram).    All questions were answered to patient's stated satisfaction. Encouraged patient to call with any new concerns or questions before her next visit to the cancer center and we can certain see her sooner, if needed.    Plan of care discussed with Dr. Talbert Cage, who agrees with the above aforementioned.    A total of 20 minutes was spent in face-to-face care of this patient, with greater than 50% of that time spent in counseling and care-coordination.    Orders placed this encounter:  Orders Placed This Encounter  Procedures  . MM DIAG BREAST TOMO BILATERAL      Mike Craze, NP [place] [phone]

## 2017-08-04 ENCOUNTER — Other Ambulatory Visit: Payer: Self-pay | Admitting: Adult Health

## 2017-08-04 DIAGNOSIS — Z9889 Other specified postprocedural states: Secondary | ICD-10-CM

## 2017-08-12 ENCOUNTER — Inpatient Hospital Stay (HOSPITAL_COMMUNITY): Admission: RE | Admit: 2017-08-12 | Payer: BLUE CROSS/BLUE SHIELD | Source: Ambulatory Visit

## 2017-09-02 ENCOUNTER — Encounter (HOSPITAL_COMMUNITY): Payer: BLUE CROSS/BLUE SHIELD

## 2017-09-25 ENCOUNTER — Ambulatory Visit
Admission: RE | Admit: 2017-09-25 | Discharge: 2017-09-25 | Disposition: A | Discharge status: Home / Self Care | Payer: BLUE CROSS/BLUE SHIELD | Source: Ambulatory Visit | Attending: Adult Health | Admitting: Adult Health

## 2017-09-25 ENCOUNTER — Other Ambulatory Visit: Payer: Self-pay | Admitting: Adult Health

## 2017-09-25 DIAGNOSIS — D0502 Lobular carcinoma in situ of left breast: Secondary | ICD-10-CM

## 2017-09-25 DIAGNOSIS — Z1231 Encounter for screening mammogram for malignant neoplasm of breast: Secondary | ICD-10-CM

## 2017-11-21 IMAGING — MG MM DIGITAL DIAGNOSTIC UNILAT*L* W/ TOMO W/ CAD
1 series · 1 of 1 positions shown · non-contrast
Comparison: Previous exam(s).

CLINICAL DATA: Confirmation of clip placement after stereotactic
tomosynthesis guided biopsy of architectural distortion in the upper
left breast at middle depth and a separate group of calcifications
in the inner left breast at middle depth.

EXAM:
3D DIAGNOSTIC LEFT MAMMOGRAM POST STEREOTACTIC BIOPSY

[L CC]
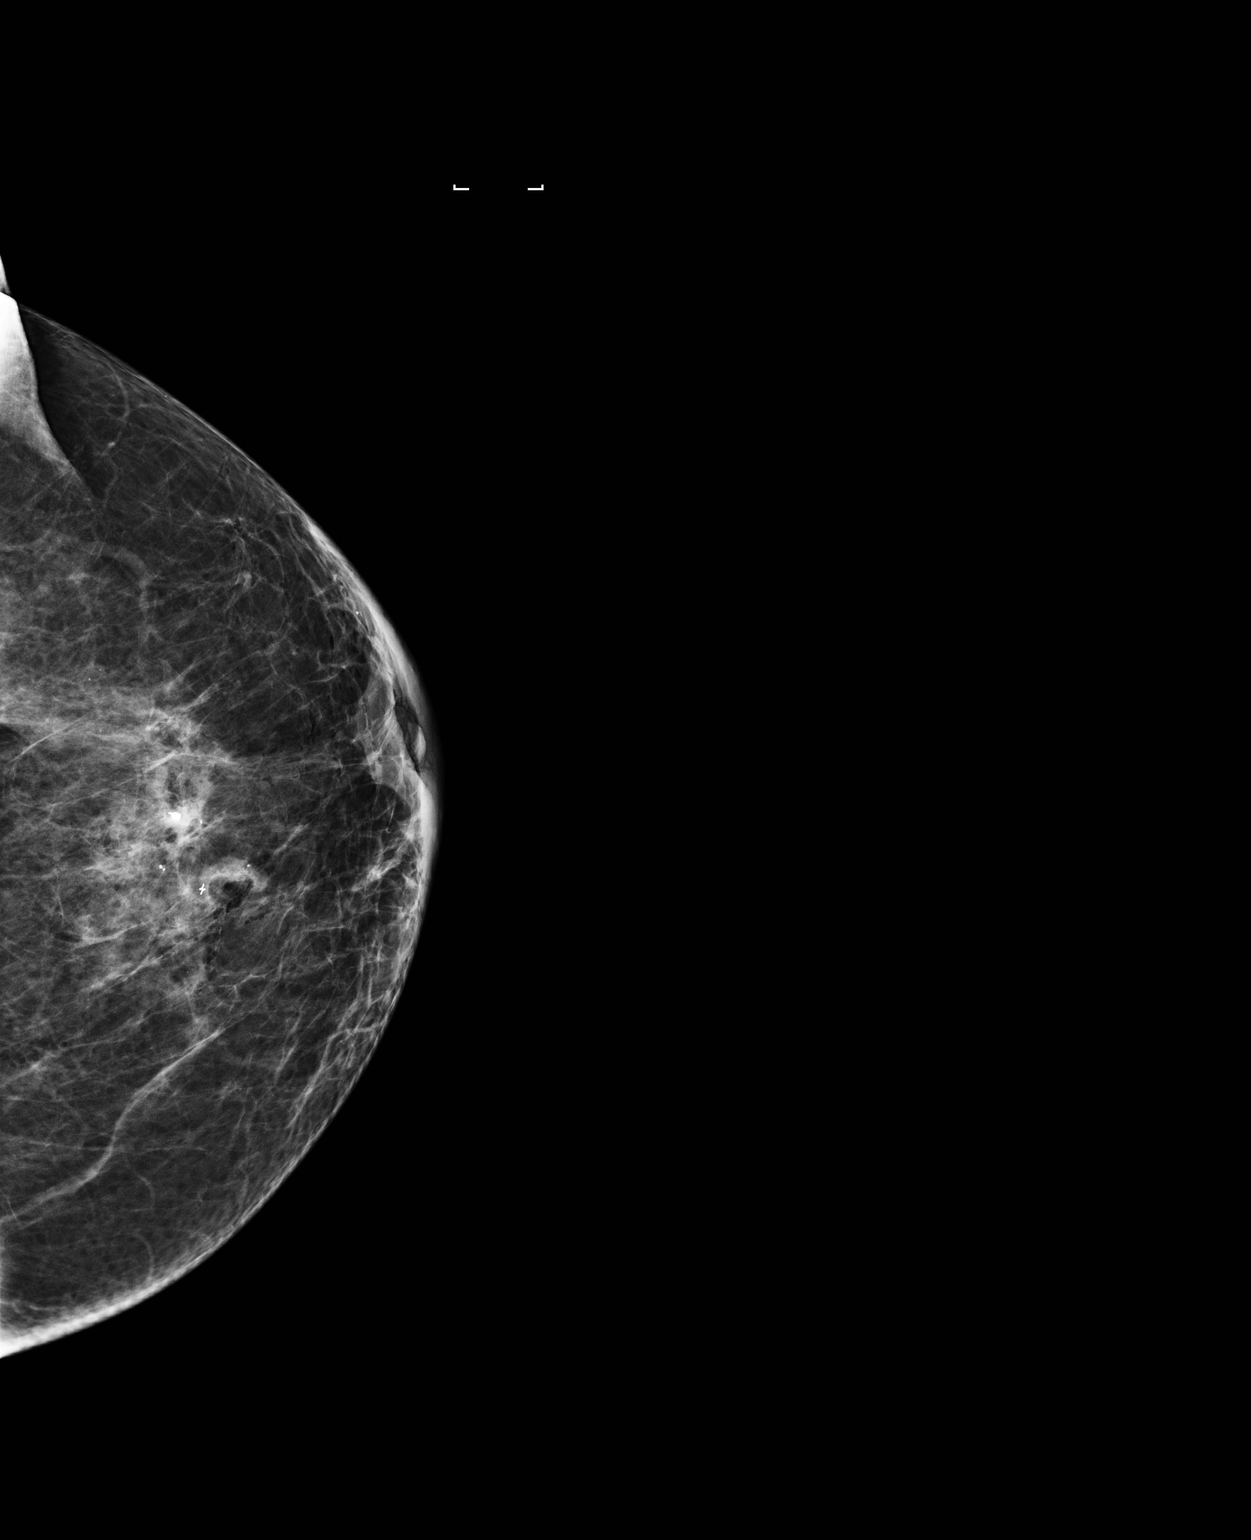

[1 of 1 positions shown; findings below may reference images not displayed]

FINDINGS: 3D mammographic images were obtained following stereotactic
tomosynthesis guided biopsy of architectural distortion in the upper
left breast at middle depth and except for calcifications in the
inner left breast at middle depth.

The coil shaped tissue marker clip is appropriately positioned at
the site of the architectural distortion in the upper left breast at
middle depth.

The X shaped tissue marker clip is appropriately positioned the site
of calcifications in the inner left breast at middle depth.

Expected post biopsy changes are present at both sites without
evidence of hematoma.
IMPRESSION: 1. Appropriate positioning of the coil shaped tissue marker clip at
the site of architectural distortion in the upper left breast at
middle depth.
2. Appropriate positioning of the X shaped tissue marker clip at the
site of calcifications in the inner left breast at middle depth.

Final Assessment: Post Procedure Mammograms for Marker Placement

## 2017-11-21 IMAGING — MG 2D DIGITAL DIAGNOSTIC UNILATERAL LEFT MAMMOGRAM WITH CAD AND ADJ
4 series · 4 of 8 positions shown · non-contrast
Comparison: Previous exam(s).

CLINICAL DATA: Confirmation of clip placement after stereotactic
tomosynthesis guided biopsy of architectural distortion in the upper
left breast at middle depth and a separate group of calcifications
in the inner left breast at middle depth.

EXAM:
3D DIAGNOSTIC LEFT MAMMOGRAM POST STEREOTACTIC BIOPSY

[L ML]
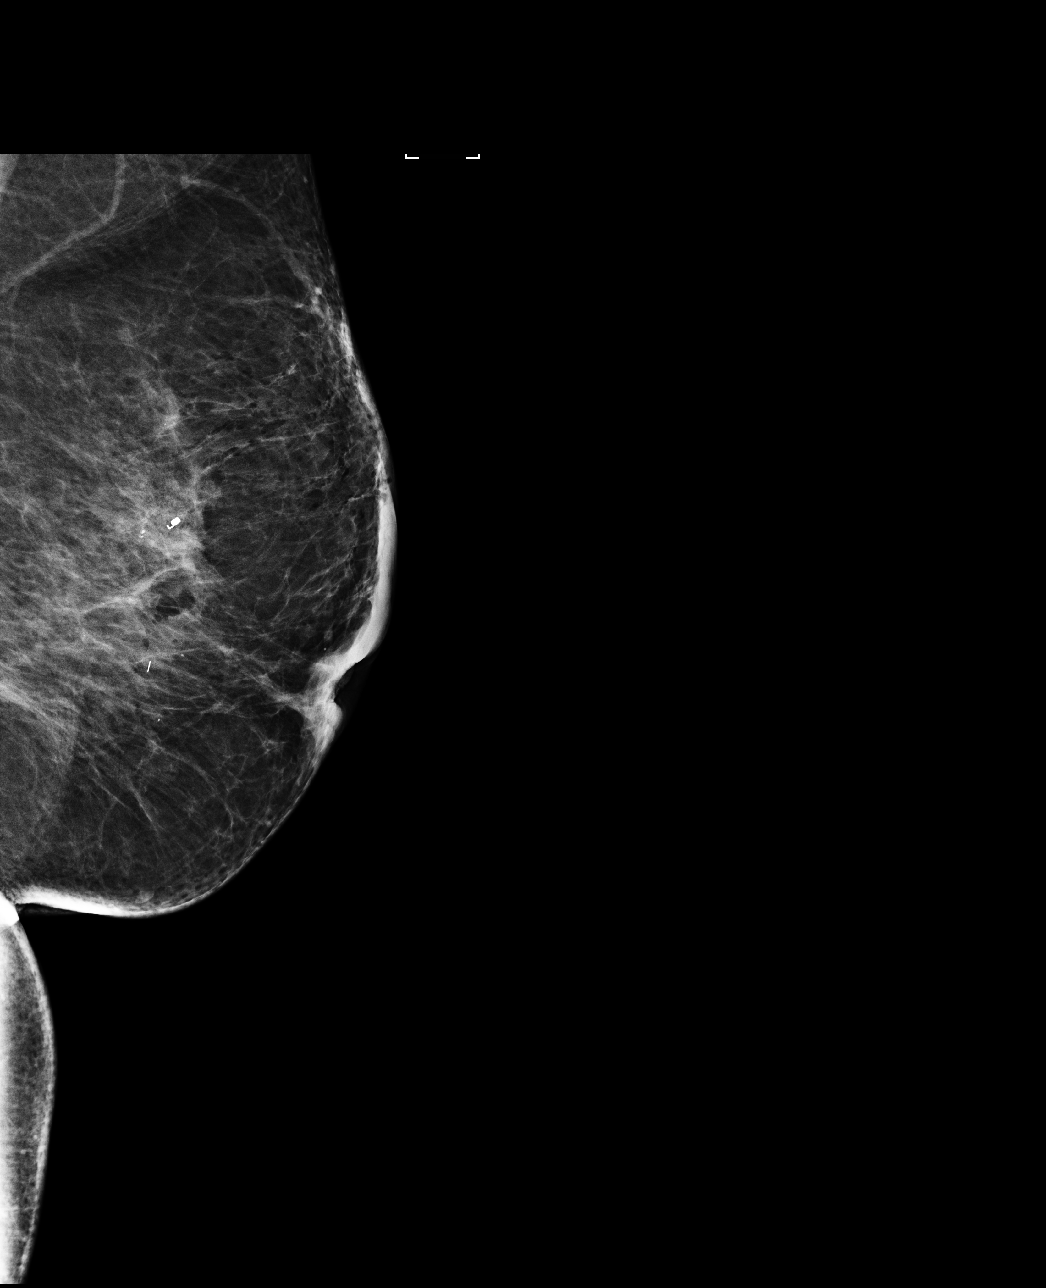

[L CC]
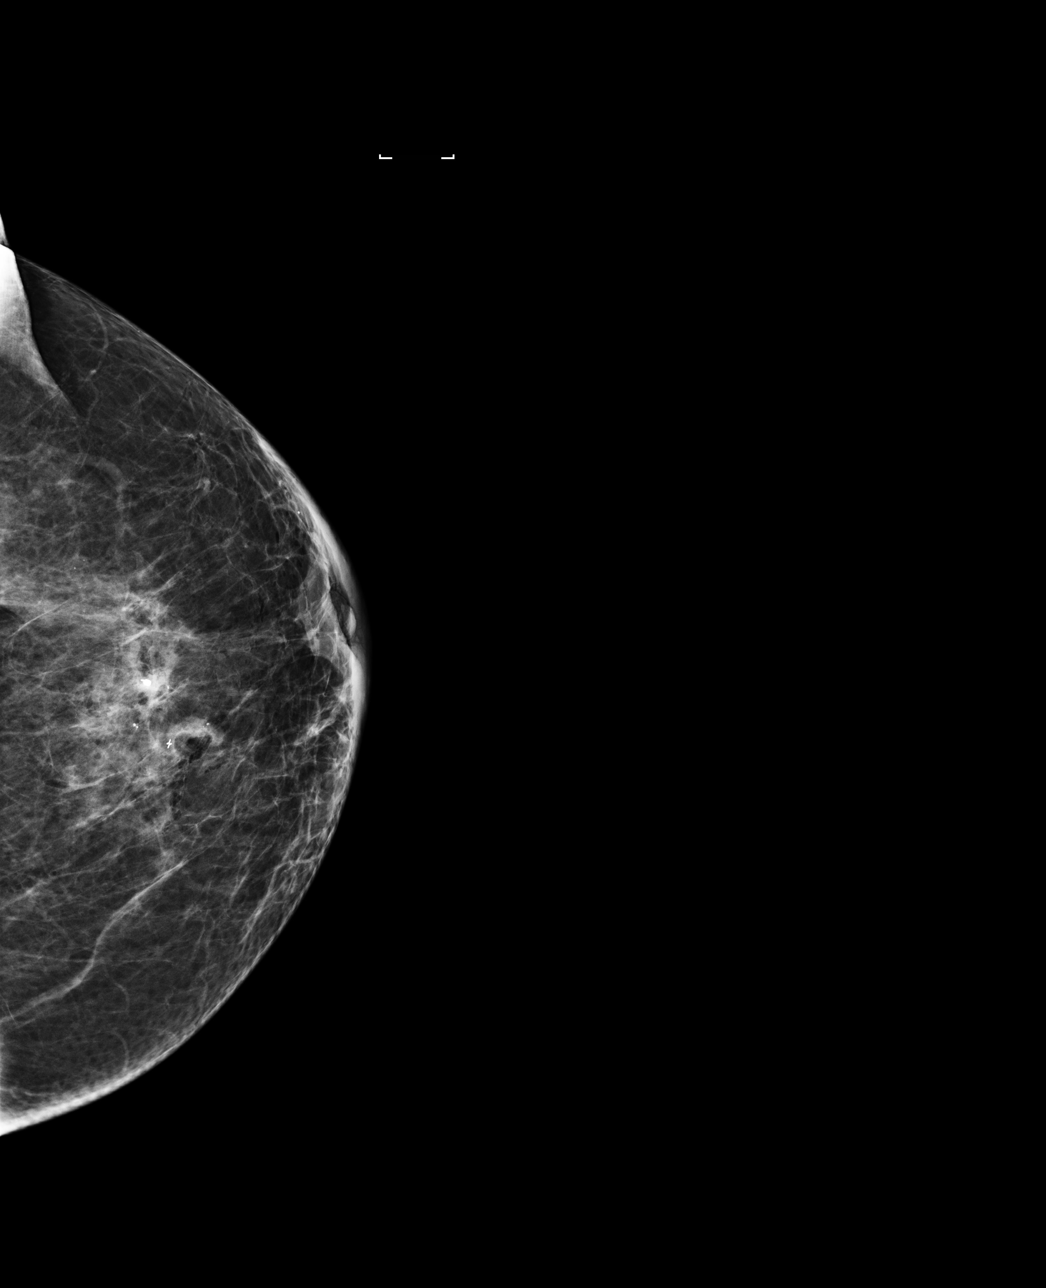

[L CC synth-2D]
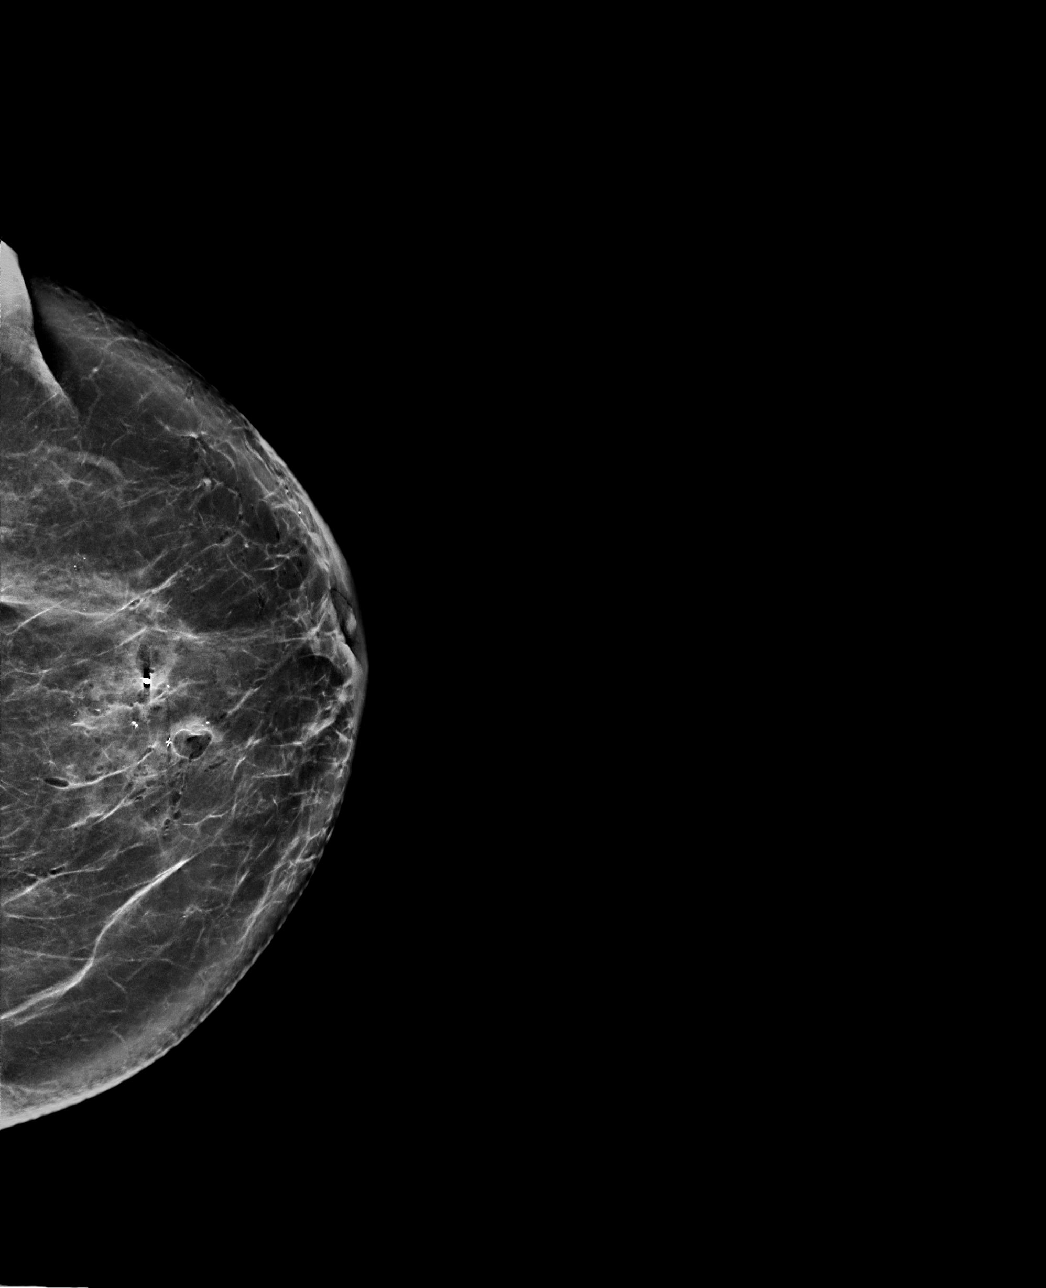

[L CC tomo · tomo slice 44/87.0]
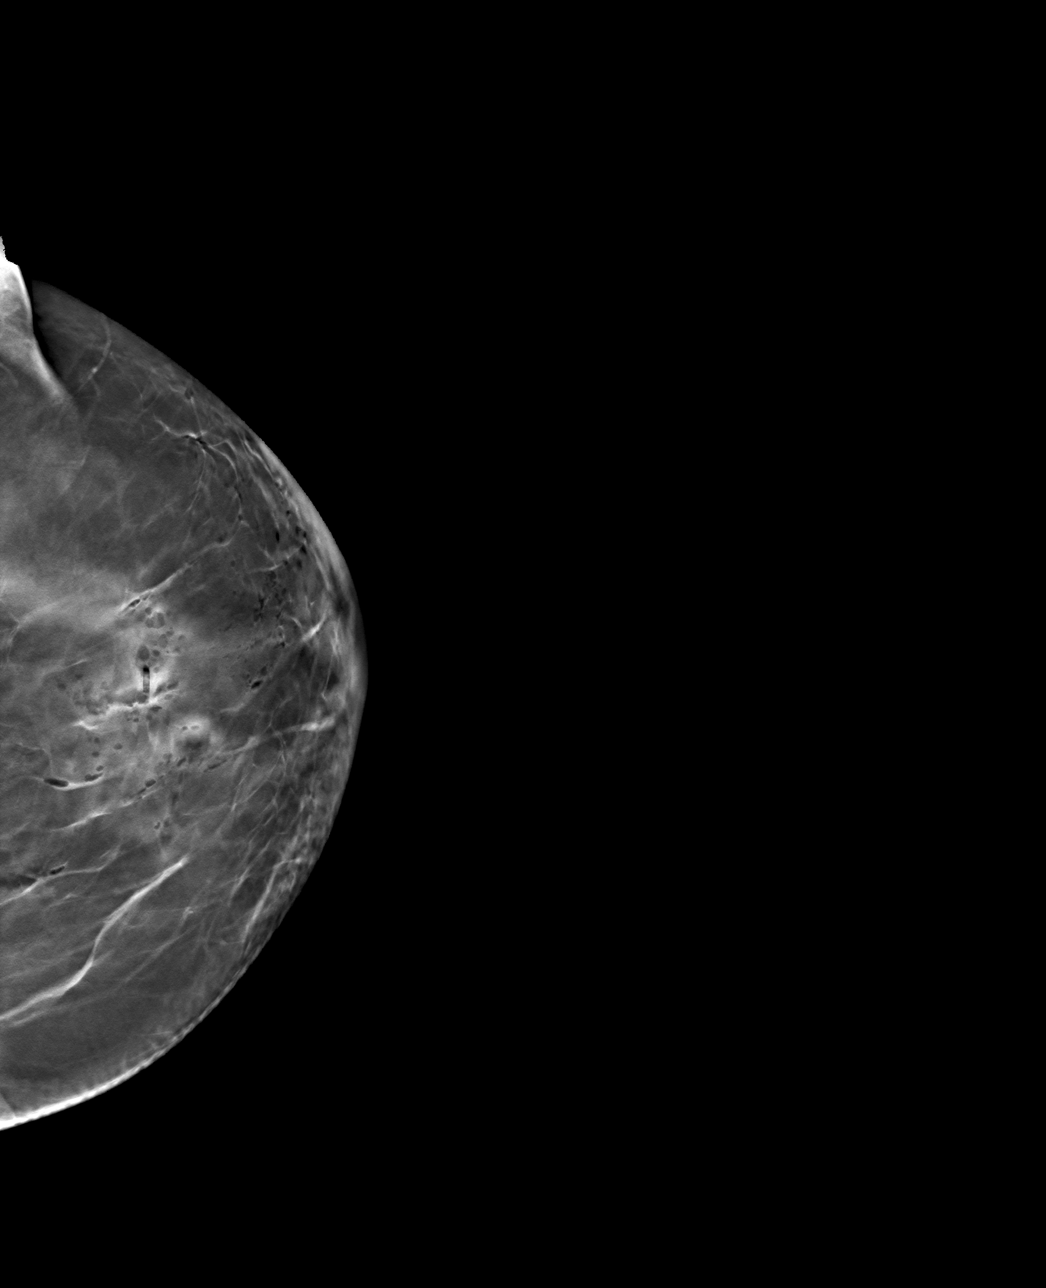

[4 of 8 positions shown; findings below may reference images not displayed]

FINDINGS: 3D mammographic images were obtained following stereotactic
tomosynthesis guided biopsy of architectural distortion in the upper
left breast at middle depth and except for calcifications in the
inner left breast at middle depth.

The coil shaped tissue marker clip is appropriately positioned at
the site of the architectural distortion in the upper left breast at
middle depth.

The X shaped tissue marker clip is appropriately positioned the site
of calcifications in the inner left breast at middle depth.

Expected post biopsy changes are present at both sites without
evidence of hematoma.
IMPRESSION: 1. Appropriate positioning of the coil shaped tissue marker clip at
the site of architectural distortion in the upper left breast at
middle depth.
2. Appropriate positioning of the X shaped tissue marker clip at the
site of calcifications in the inner left breast at middle depth.

Final Assessment: Post Procedure Mammograms for Marker Placement

## 2018-02-10 NOTE — Progress Notes (Deleted)
[name], [name]   CLINIC:  Medical Oncology/Hematology  PCP:  Shelbie Ammons, MD No address on file None   REASON FOR VISIT:  Follow-up for LCIS of left breast   CURRENT THERAPY: Surveillance     HISTORY OF PRESENT ILLNESS:  (From Dr. Donald Pore note on 10/24/16)      INTERVAL HISTORY:  [name] 62 y.o. 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    Overall, she tells me she has been feeling very well. Appetite and energy levels are 100%.  She denies any new breast concerns. She performs self-breast exams and denies any changes.  At her last visit to the cancer center in 09/2016, Dr. Whitney Muse talked to her about chemoprevention with anti-estrogen therapy. She declined anti-estrogen therapy at that time.   She sees her PCP regularly. Reports that she is up-to-date with her colonoscopy (2017), pap smear (2018), and her vaccines.    She is largely without complaints today.    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 She is married and has 3 step-children, 11 grandchildren, and 5 great-grandchildren.  Denies any current tobacco use; previously smoked 1 ppd x 34 years (quit in 2008). Denies any alcohol use.     REVIEW OF SYSTEMS:  Review of Systems  Constitutional: Negative.  Negative for chills, fatigue and fever.  HENT:  Negative.  Negative for lump/mass and nosebleeds.   Eyes: Negative.   Respiratory: Negative.  Negative for cough and shortness of breath.   Cardiovascular: Negative.  Negative for chest pain and leg swelling.  Gastrointestinal: Negative.  Negative for abdominal pain, blood in stool, constipation, diarrhea, nausea and vomiting.  Endocrine: Negative.   Genitourinary: Negative.  Negative for dysuria and hematuria.   Musculoskeletal: Negative.  Negative for  arthralgias.  Skin: Negative.  Negative for rash.  Neurological: Negative.  Negative for dizziness and headaches.  Hematological: Negative.  Negative for adenopathy. Does not bruise/bleed easily.  Psychiatric/Behavioral: Negative.  Negative for depression and sleep disturbance. The patient is not nervous/anxious.      PAST MEDICAL/SURGICAL HISTORY:  Past Medical History:  Diagnosis Date  . Arthritis    RA  . Breast mass, left   . COPD (chronic obstructive pulmonary disease) ([facility])   . Hyperlipidemia   . Hypertension   . Hypothyroidism    Past Surgical History:  Procedure Laterality Date  . BREAST EXCISIONAL BIOPSY Left 09/16/2016   LCIS x 2  . BREAST LUMPECTOMY WITH RADIOACTIVE SEED LOCALIZATION Left 09/16/2016   Procedure: LEFT BREAST LUMPECTOMY WITH RADIOACTIVE SEED LOCALIZATION X 2;  Surgeon: Autumn Messing III, MD;  Location: [facility];  Service: General;  Laterality: Left;  . CARPAL TUNNEL RELEASE Left   . LUNG SURGERY     2008-removed benign mass rt lung, 2010- scraping left lung for emphysema     SOCIAL HISTORY:  Social History   Socioeconomic History  . Marital status: Married    Spouse name: Not on file  . Number of children: Not on file  . Years of education: Not on file  . Highest education level: Not on file  Occupational History  . Not on file  Social Needs  . Financial resource strain: Not on file  . Food insecurity:    Worry: Not on file    Inability: Not on file  .  Transportation needs:    Medical: Not on file    Non-medical: Not on file  Tobacco Use  . Smoking status: Former Smoker    Packs/day: 1.00    Last attempt to quit: 09/10/2007    Years since quitting: 10.4  . Smokeless tobacco: Never Used  Substance and Sexual Activity  . Alcohol use: No  . Drug use: No  . Sexual activity: Not on file  Lifestyle  . Physical activity:    Days per week: Not on file    Minutes per session: Not on file  . Stress: Not on file    Relationships  . Social connections:    Talks on phone: Not on file    Gets together: Not on file    Attends religious service: Not on file    Active member of club or organization: Not on file    Attends meetings of clubs or organizations: Not on file    Relationship status: Not on file  . Intimate partner violence:    Fear of current or ex partner: Not on file    Emotionally abused: Not on file    Physically abused: Not on file    Forced sexual activity: Not on file  Other Topics Concern  . Not on file  Social History Narrative  . Not on file    FAMILY HISTORY:  Family History  Problem Relation Age of Onset  . Breast cancer Neg Hx     CURRENT MEDICATIONS:  Outpatient Encounter Medications as of 02/11/2018  Medication Sig  . atorvastatin (LIPITOR) 20 MG tablet Take 20 mg by mouth daily.  Marland Kitchen levothyroxine (SYNTHROID, LEVOTHROID) 50 MCG tablet Take 50 mcg by mouth daily before breakfast.  . metoprolol tartrate (LOPRESSOR) 25 MG tablet Take 25 mg by mouth 2 (two) times daily.  . Multiple Vitamin (MULTIVITAMIN WITH MINERALS) TABS tablet Take 1 tablet by mouth daily.  . Tofacitinib Citrate (XELJANZ) 5 MG TABS Take 1 tablet by mouth 2 (two) times daily.    No facility-administered encounter medications on file as of 02/11/2018.     ALLERGIES:  Allergies  Allergen Reactions  . Codeine Swelling  . Hydrocodone Swelling  . Mucinex [Guaifenesin Er] Itching     PHYSICAL EXAM:  ECOG Performance status: 0 - Asymptomatic   There were no vitals filed for this visit. There were no vitals filed for this visit.  Physical Exam  Constitutional: She is oriented to person, place, and time and well-developed, well-nourished, and in no distress.  HENT:  Head: Normocephalic.  Mouth/Throat: Oropharynx is clear and moist. No oropharyngeal exudate.  Eyes: Pupils are equal, round, and reactive to light. Conjunctivae are normal. No scleral icterus.  Neck: Normal range of motion. Neck  supple.  Cardiovascular: Normal rate, regular rhythm and normal heart sounds.  Pulmonary/Chest: Effort normal and breath sounds normal. No respiratory distress. She has no wheezes. She has no rales.    Abdominal: Soft. Bowel sounds are normal. There is no tenderness. There is no rebound and no guarding.  Musculoskeletal: Normal range of motion. She exhibits no edema.  Lymphadenopathy:    She has no cervical adenopathy.       Right: No supraclavicular adenopathy present.       Left: No supraclavicular adenopathy present.  Neurological: She is alert and oriented to person, place, and time. No cranial nerve deficit. Gait normal.  Skin: Skin is warm and dry. No rash noted.  Psychiatric: Mood, memory, affect and judgment normal.  Nursing note  and vitals reviewed.    LABORATORY DATA:  I have reviewed the labs as listed.  CBC    Component Value Date/Time   WBC 8.3 11/17/2008 0400   RBC 3.38 (L) 11/17/2008 0400   HGB 9.6 (L) 11/17/2008 0400   HCT 29.1 (L) 11/17/2008 0400   PLT 312 11/17/2008 0400   MCV 86.2 11/17/2008 0400   MCHC 33.0 11/17/2008 0400   RDW 15.9 (H) 11/17/2008 0400   CMP Latest Ref Rng & Units 09/11/2016 11/17/2008 11/16/2008  Glucose 65 - 99 mg/dL 99 103(H) 83  BUN 6 - 20 mg/dL 9 5(L) 5(L)  Creatinine 0.44 - 1.00 mg/dL 0.76 0.51 0.51  Sodium 135 - 145 mmol/L 141 138 139  Potassium 3.5 - 5.1 mmol/L 4.8 4.2 3.5  Chloride 101 - 111 mmol/L 105 102 102  CO2 22 - 32 mmol/L 28 29 29   Calcium 8.9 - 10.3 mg/dL 10.0 8.5 8.3(L)  Total Protein 6.0 - 8.3 g/dL - - -  Total Bilirubin 0.3 - 1.2 mg/dL - - -  Alkaline Phos 39 - 117 U/L - - -  AST 0 - 37 U/L - - -  ALT 0 - 35 U/L - - -    PENDING LABS:    DIAGNOSTIC IMAGING:  *The following radiologic images and reports have been reviewed independently and agree with below findings.  Mammogram: 09/25/17 CLINICAL DATA:  Screening.  EXAM: 2D DIGITAL SCREENING BILATERAL MAMMOGRAM WITH CAD AND ADJUNCT TOMO  COMPARISON:   Previous exam(s).  ACR Breast Density Category c: The breast tissue is heterogeneously dense, which may obscure small masses.  FINDINGS: There are no findings suspicious for malignancy. Images were processed with CAD.  IMPRESSION: No mammographic evidence of malignancy. A result letter of this screening mammogram will be mailed directly to the patient.  RECOMMENDATION: Screening mammogram in one year. (Code:SM-B-01Y)  BI-RADS CATEGORY  1: Negative.   Electronically Signed   By: Curlene Dolphin M.D.   On: 09/25/2017 16:45    PATHOLOGY:  (L) lumpectomy surgical path: 09/16/16            ASSESSMENT & PLAN:   Left breast LCIS:  -Diagnosed in 07/2016. Treated with lumpectomy by Dr. Marlou Starks on 09/16/16.  -Reiterated that LCIS is considered a high-risk breast lesion, which places her at increased risk for invasive breast cancers in the future.  Again reviewed the role that anti-estrogen therapy would play in reducing this risk; she declines anti-estrogen therapy.  -Annual screening breast MRI could be considered for high-risk patients, however, only those patients who are identified as having a 20-25% lifetime risk of developing breast cancer calculated by the [facility] model (rather than the [place], which should not be used to assess lifetime risk of breast cancer in patients with history of LCIS).  -Diagnostic mammogram due in 07/2017; orders placed today.  -Return to cancer center in 01/2018 so that she has either a clinical breast exam or mammogram every 6 months. We will plan to see the patient every April and October, with mammogram annually in October.  She is agreeable with this plan.    Health maintenance:  -Encouraged continued follow-up with PCP as directed.  She is reportedly up-to-date with her colonoscopy (2017), pap smear (2018), and her vaccines.   -Could consider possibility of lung cancer screening with low-dose CT chest for patient in the future given  her 34 pack year history of smoking and cessation < 15 years.  Did not discuss today and will consider at  subsequent follow-up.        Dispo:  -Diagnostic mammogram due in 07/2017; orders placed today.  -Return to cancer center in 01/2018 (6 months after upcoming mammogram).    All questions were answered to patient's stated satisfaction. Encouraged patient to call with any new concerns or questions before her next visit to the cancer center and we can certain see her sooner, if needed.       Orders placed this encounter:  No orders of the defined types were placed in this encounter.     Mike Craze, NP [facility] [phone]

## 2018-02-11 ENCOUNTER — Ambulatory Visit (HOSPITAL_COMMUNITY): Payer: BLUE CROSS/BLUE SHIELD | Admitting: Adult Health

## 2019-05-24 DIAGNOSIS — E7849 Other hyperlipidemia: Secondary | ICD-10-CM | POA: Diagnosis not present

## 2019-05-24 DIAGNOSIS — I10 Essential (primary) hypertension: Secondary | ICD-10-CM | POA: Diagnosis not present

## 2019-05-24 DIAGNOSIS — J45909 Unspecified asthma, uncomplicated: Secondary | ICD-10-CM | POA: Diagnosis not present

## 2019-05-24 DIAGNOSIS — Z79899 Other long term (current) drug therapy: Secondary | ICD-10-CM | POA: Diagnosis not present

## 2019-05-24 DIAGNOSIS — E038 Other specified hypothyroidism: Secondary | ICD-10-CM | POA: Diagnosis not present

## 2019-08-23 DIAGNOSIS — J45909 Unspecified asthma, uncomplicated: Secondary | ICD-10-CM | POA: Diagnosis not present

## 2019-08-23 DIAGNOSIS — Z0000 Encounter for general adult medical examination without abnormal findings: Secondary | ICD-10-CM | POA: Diagnosis not present

## 2019-08-23 DIAGNOSIS — E7849 Other hyperlipidemia: Secondary | ICD-10-CM | POA: Diagnosis not present

## 2019-08-23 DIAGNOSIS — E038 Other specified hypothyroidism: Secondary | ICD-10-CM | POA: Diagnosis not present

## 2019-08-23 DIAGNOSIS — I10 Essential (primary) hypertension: Secondary | ICD-10-CM | POA: Diagnosis not present

## 2019-09-14 DIAGNOSIS — M0579 Rheumatoid arthritis with rheumatoid factor of multiple sites without organ or systems involvement: Secondary | ICD-10-CM | POA: Diagnosis not present

## 2019-09-14 DIAGNOSIS — Z79899 Other long term (current) drug therapy: Secondary | ICD-10-CM | POA: Diagnosis not present

## 2019-11-30 DIAGNOSIS — E038 Other specified hypothyroidism: Secondary | ICD-10-CM | POA: Diagnosis not present

## 2019-11-30 DIAGNOSIS — J45909 Unspecified asthma, uncomplicated: Secondary | ICD-10-CM | POA: Diagnosis not present

## 2019-11-30 DIAGNOSIS — I10 Essential (primary) hypertension: Secondary | ICD-10-CM | POA: Diagnosis not present

## 2019-11-30 DIAGNOSIS — E119 Type 2 diabetes mellitus without complications: Secondary | ICD-10-CM | POA: Diagnosis not present

## 2019-11-30 DIAGNOSIS — Z0000 Encounter for general adult medical examination without abnormal findings: Secondary | ICD-10-CM | POA: Diagnosis not present

## 2019-11-30 DIAGNOSIS — E7849 Other hyperlipidemia: Secondary | ICD-10-CM | POA: Diagnosis not present

## 2020-01-25 DIAGNOSIS — Z1231 Encounter for screening mammogram for malignant neoplasm of breast: Secondary | ICD-10-CM | POA: Diagnosis not present

## 2020-03-01 DIAGNOSIS — E038 Other specified hypothyroidism: Secondary | ICD-10-CM | POA: Diagnosis not present

## 2020-03-01 DIAGNOSIS — E7849 Other hyperlipidemia: Secondary | ICD-10-CM | POA: Diagnosis not present

## 2020-03-01 DIAGNOSIS — M06879 Other specified rheumatoid arthritis, unspecified ankle and foot: Secondary | ICD-10-CM | POA: Diagnosis not present

## 2020-03-01 DIAGNOSIS — I10 Essential (primary) hypertension: Secondary | ICD-10-CM | POA: Diagnosis not present

## 2020-03-01 DIAGNOSIS — J45909 Unspecified asthma, uncomplicated: Secondary | ICD-10-CM | POA: Diagnosis not present

## 2020-03-01 DIAGNOSIS — Z0000 Encounter for general adult medical examination without abnormal findings: Secondary | ICD-10-CM | POA: Diagnosis not present

## 2020-03-14 DIAGNOSIS — Z79899 Other long term (current) drug therapy: Secondary | ICD-10-CM | POA: Diagnosis not present

## 2020-03-14 DIAGNOSIS — M0579 Rheumatoid arthritis with rheumatoid factor of multiple sites without organ or systems involvement: Secondary | ICD-10-CM | POA: Diagnosis not present

## 2020-06-06 DIAGNOSIS — E119 Type 2 diabetes mellitus without complications: Secondary | ICD-10-CM | POA: Diagnosis not present

## 2020-06-06 DIAGNOSIS — J45909 Unspecified asthma, uncomplicated: Secondary | ICD-10-CM | POA: Diagnosis not present

## 2020-06-06 DIAGNOSIS — E038 Other specified hypothyroidism: Secondary | ICD-10-CM | POA: Diagnosis not present

## 2020-06-06 DIAGNOSIS — E7849 Other hyperlipidemia: Secondary | ICD-10-CM | POA: Diagnosis not present

## 2020-06-06 DIAGNOSIS — M06879 Other specified rheumatoid arthritis, unspecified ankle and foot: Secondary | ICD-10-CM | POA: Diagnosis not present

## 2020-06-06 DIAGNOSIS — I10 Essential (primary) hypertension: Secondary | ICD-10-CM | POA: Diagnosis not present

## 2020-06-06 DIAGNOSIS — Z0000 Encounter for general adult medical examination without abnormal findings: Secondary | ICD-10-CM | POA: Diagnosis not present

## 2020-08-15 DIAGNOSIS — Z79899 Other long term (current) drug therapy: Secondary | ICD-10-CM | POA: Diagnosis not present

## 2020-08-15 DIAGNOSIS — E039 Hypothyroidism, unspecified: Secondary | ICD-10-CM | POA: Diagnosis not present

## 2020-08-15 DIAGNOSIS — E785 Hyperlipidemia, unspecified: Secondary | ICD-10-CM | POA: Diagnosis not present

## 2020-08-15 DIAGNOSIS — Z23 Encounter for immunization: Secondary | ICD-10-CM | POA: Diagnosis not present

## 2020-08-15 DIAGNOSIS — M0579 Rheumatoid arthritis with rheumatoid factor of multiple sites without organ or systems involvement: Secondary | ICD-10-CM | POA: Diagnosis not present

## 2020-09-06 DIAGNOSIS — E7849 Other hyperlipidemia: Secondary | ICD-10-CM | POA: Diagnosis not present

## 2020-09-06 DIAGNOSIS — E119 Type 2 diabetes mellitus without complications: Secondary | ICD-10-CM | POA: Diagnosis not present

## 2020-09-06 DIAGNOSIS — M06879 Other specified rheumatoid arthritis, unspecified ankle and foot: Secondary | ICD-10-CM | POA: Diagnosis not present

## 2020-09-06 DIAGNOSIS — I10 Essential (primary) hypertension: Secondary | ICD-10-CM | POA: Diagnosis not present

## 2020-09-06 DIAGNOSIS — E038 Other specified hypothyroidism: Secondary | ICD-10-CM | POA: Diagnosis not present

## 2020-12-07 DIAGNOSIS — E7849 Other hyperlipidemia: Secondary | ICD-10-CM | POA: Diagnosis not present

## 2020-12-07 DIAGNOSIS — Z0000 Encounter for general adult medical examination without abnormal findings: Secondary | ICD-10-CM | POA: Diagnosis not present

## 2020-12-07 DIAGNOSIS — E1143 Type 2 diabetes mellitus with diabetic autonomic (poly)neuropathy: Secondary | ICD-10-CM | POA: Diagnosis not present

## 2020-12-07 DIAGNOSIS — M06879 Other specified rheumatoid arthritis, unspecified ankle and foot: Secondary | ICD-10-CM | POA: Diagnosis not present

## 2020-12-07 DIAGNOSIS — I10 Essential (primary) hypertension: Secondary | ICD-10-CM | POA: Diagnosis not present

## 2020-12-07 DIAGNOSIS — E0843 Diabetes mellitus due to underlying condition with diabetic autonomic (poly)neuropathy: Secondary | ICD-10-CM | POA: Diagnosis not present

## 2020-12-07 DIAGNOSIS — J3081 Allergic rhinitis due to animal (cat) (dog) hair and dander: Secondary | ICD-10-CM | POA: Diagnosis not present

## 2020-12-07 DIAGNOSIS — E038 Other specified hypothyroidism: Secondary | ICD-10-CM | POA: Diagnosis not present

## 2020-12-07 DIAGNOSIS — J45909 Unspecified asthma, uncomplicated: Secondary | ICD-10-CM | POA: Diagnosis not present

## 2021-03-01 DIAGNOSIS — E785 Hyperlipidemia, unspecified: Secondary | ICD-10-CM | POA: Diagnosis not present

## 2021-03-01 DIAGNOSIS — Z79899 Other long term (current) drug therapy: Secondary | ICD-10-CM | POA: Diagnosis not present

## 2021-03-01 DIAGNOSIS — M0579 Rheumatoid arthritis with rheumatoid factor of multiple sites without organ or systems involvement: Secondary | ICD-10-CM | POA: Diagnosis not present

## 2021-03-01 DIAGNOSIS — E039 Hypothyroidism, unspecified: Secondary | ICD-10-CM | POA: Diagnosis not present

## 2021-03-27 DIAGNOSIS — I10 Essential (primary) hypertension: Secondary | ICD-10-CM | POA: Diagnosis not present

## 2021-03-27 DIAGNOSIS — Z0000 Encounter for general adult medical examination without abnormal findings: Secondary | ICD-10-CM | POA: Diagnosis not present

## 2021-03-27 DIAGNOSIS — E7849 Other hyperlipidemia: Secondary | ICD-10-CM | POA: Diagnosis not present

## 2021-03-27 DIAGNOSIS — J45909 Unspecified asthma, uncomplicated: Secondary | ICD-10-CM | POA: Diagnosis not present

## 2021-03-27 DIAGNOSIS — E038 Other specified hypothyroidism: Secondary | ICD-10-CM | POA: Diagnosis not present

## 2021-03-27 DIAGNOSIS — E1143 Type 2 diabetes mellitus with diabetic autonomic (poly)neuropathy: Secondary | ICD-10-CM | POA: Diagnosis not present

## 2021-03-27 DIAGNOSIS — M06879 Other specified rheumatoid arthritis, unspecified ankle and foot: Secondary | ICD-10-CM | POA: Diagnosis not present

## 2021-03-27 DIAGNOSIS — J3081 Allergic rhinitis due to animal (cat) (dog) hair and dander: Secondary | ICD-10-CM | POA: Diagnosis not present

## 2021-03-29 DIAGNOSIS — Z7984 Long term (current) use of oral hypoglycemic drugs: Secondary | ICD-10-CM | POA: Diagnosis not present

## 2021-03-29 DIAGNOSIS — H25813 Combined forms of age-related cataract, bilateral: Secondary | ICD-10-CM | POA: Diagnosis not present

## 2021-03-29 DIAGNOSIS — H52223 Regular astigmatism, bilateral: Secondary | ICD-10-CM | POA: Diagnosis not present

## 2021-03-29 DIAGNOSIS — H1045 Other chronic allergic conjunctivitis: Secondary | ICD-10-CM | POA: Diagnosis not present

## 2021-03-29 DIAGNOSIS — E119 Type 2 diabetes mellitus without complications: Secondary | ICD-10-CM | POA: Diagnosis not present

## 2021-03-29 DIAGNOSIS — H5213 Myopia, bilateral: Secondary | ICD-10-CM | POA: Diagnosis not present

## 2021-03-29 DIAGNOSIS — H524 Presbyopia: Secondary | ICD-10-CM | POA: Diagnosis not present

## 2021-06-27 DIAGNOSIS — E038 Other specified hypothyroidism: Secondary | ICD-10-CM | POA: Diagnosis not present

## 2021-06-27 DIAGNOSIS — M06879 Other specified rheumatoid arthritis, unspecified ankle and foot: Secondary | ICD-10-CM | POA: Diagnosis not present

## 2021-06-27 DIAGNOSIS — J45909 Unspecified asthma, uncomplicated: Secondary | ICD-10-CM | POA: Diagnosis not present

## 2021-06-27 DIAGNOSIS — Z0000 Encounter for general adult medical examination without abnormal findings: Secondary | ICD-10-CM | POA: Diagnosis not present

## 2021-06-27 DIAGNOSIS — J3081 Allergic rhinitis due to animal (cat) (dog) hair and dander: Secondary | ICD-10-CM | POA: Diagnosis not present

## 2021-06-27 DIAGNOSIS — E1143 Type 2 diabetes mellitus with diabetic autonomic (poly)neuropathy: Secondary | ICD-10-CM | POA: Diagnosis not present

## 2021-06-27 DIAGNOSIS — I10 Essential (primary) hypertension: Secondary | ICD-10-CM | POA: Diagnosis not present

## 2021-06-27 DIAGNOSIS — E7849 Other hyperlipidemia: Secondary | ICD-10-CM | POA: Diagnosis not present

## 2021-07-26 DIAGNOSIS — H25813 Combined forms of age-related cataract, bilateral: Secondary | ICD-10-CM | POA: Diagnosis not present

## 2021-07-26 DIAGNOSIS — H01002 Unspecified blepharitis right lower eyelid: Secondary | ICD-10-CM | POA: Diagnosis not present

## 2021-07-26 DIAGNOSIS — H01001 Unspecified blepharitis right upper eyelid: Secondary | ICD-10-CM | POA: Diagnosis not present

## 2021-07-26 DIAGNOSIS — E119 Type 2 diabetes mellitus without complications: Secondary | ICD-10-CM | POA: Diagnosis not present

## 2021-09-03 DIAGNOSIS — E039 Hypothyroidism, unspecified: Secondary | ICD-10-CM | POA: Diagnosis not present

## 2021-09-03 DIAGNOSIS — Z23 Encounter for immunization: Secondary | ICD-10-CM | POA: Diagnosis not present

## 2021-09-03 DIAGNOSIS — E785 Hyperlipidemia, unspecified: Secondary | ICD-10-CM | POA: Diagnosis not present

## 2021-09-03 DIAGNOSIS — M0579 Rheumatoid arthritis with rheumatoid factor of multiple sites without organ or systems involvement: Secondary | ICD-10-CM | POA: Diagnosis not present

## 2021-09-03 DIAGNOSIS — Z79899 Other long term (current) drug therapy: Secondary | ICD-10-CM | POA: Diagnosis not present

## 2021-09-06 ENCOUNTER — Encounter (HOSPITAL_COMMUNITY): Payer: BLUE CROSS/BLUE SHIELD

## 2021-09-14 ENCOUNTER — Ambulatory Visit: Admit: 2021-09-14 | Payer: BLUE CROSS/BLUE SHIELD | Admitting: Ophthalmology

## 2021-09-14 SURGERY — PHACOEMULSIFICATION, CATARACT, WITH IOL INSERTION
Anesthesia: Monitor Anesthesia Care | Laterality: Right

## 2021-10-02 DIAGNOSIS — I10 Essential (primary) hypertension: Secondary | ICD-10-CM | POA: Diagnosis not present

## 2021-10-02 DIAGNOSIS — E038 Other specified hypothyroidism: Secondary | ICD-10-CM | POA: Diagnosis not present

## 2021-10-02 DIAGNOSIS — J3081 Allergic rhinitis due to animal (cat) (dog) hair and dander: Secondary | ICD-10-CM | POA: Diagnosis not present

## 2021-10-02 DIAGNOSIS — E1143 Type 2 diabetes mellitus with diabetic autonomic (poly)neuropathy: Secondary | ICD-10-CM | POA: Diagnosis not present

## 2021-10-02 DIAGNOSIS — E7849 Other hyperlipidemia: Secondary | ICD-10-CM | POA: Diagnosis not present

## 2021-10-02 DIAGNOSIS — J45909 Unspecified asthma, uncomplicated: Secondary | ICD-10-CM | POA: Diagnosis not present

## 2021-10-02 DIAGNOSIS — M06879 Other specified rheumatoid arthritis, unspecified ankle and foot: Secondary | ICD-10-CM | POA: Diagnosis not present

## 2022-01-08 DIAGNOSIS — J3081 Allergic rhinitis due to animal (cat) (dog) hair and dander: Secondary | ICD-10-CM | POA: Diagnosis not present

## 2022-01-08 DIAGNOSIS — J45909 Unspecified asthma, uncomplicated: Secondary | ICD-10-CM | POA: Diagnosis not present

## 2022-01-08 DIAGNOSIS — Z0000 Encounter for general adult medical examination without abnormal findings: Secondary | ICD-10-CM | POA: Diagnosis not present

## 2022-01-08 DIAGNOSIS — E038 Other specified hypothyroidism: Secondary | ICD-10-CM | POA: Diagnosis not present

## 2022-01-08 DIAGNOSIS — E1143 Type 2 diabetes mellitus with diabetic autonomic (poly)neuropathy: Secondary | ICD-10-CM | POA: Diagnosis not present

## 2022-01-08 DIAGNOSIS — E7849 Other hyperlipidemia: Secondary | ICD-10-CM | POA: Diagnosis not present

## 2022-01-08 DIAGNOSIS — M06879 Other specified rheumatoid arthritis, unspecified ankle and foot: Secondary | ICD-10-CM | POA: Diagnosis not present

## 2022-01-08 DIAGNOSIS — I10 Essential (primary) hypertension: Secondary | ICD-10-CM | POA: Diagnosis not present

## 2022-02-07 ENCOUNTER — Other Ambulatory Visit: Payer: Self-pay

## 2022-02-07 ENCOUNTER — Encounter (HOSPITAL_COMMUNITY): Payer: Self-pay

## 2022-02-07 ENCOUNTER — Encounter (HOSPITAL_COMMUNITY)
Admission: RE | Admit: 2022-02-07 | Discharge: 2022-02-07 | Disposition: A | Discharge status: Home / Self Care | Payer: BLUE CROSS/BLUE SHIELD | Source: Ambulatory Visit | Attending: Ophthalmology | Admitting: Ophthalmology

## 2022-02-07 DIAGNOSIS — H25811 Combined forms of age-related cataract, right eye: Secondary | ICD-10-CM | POA: Diagnosis not present

## 2022-02-07 HISTORY — DX: Prediabetes: R73.03

## 2022-02-07 NOTE — Pre-Procedure Instructions (Signed)
Left voice mail for patient to call back for pre-op phone call. °

## 2022-02-11 NOTE — H&P (Signed)
Surgical History & Physical ? ?Patient Name: [name] DOB: [birth year] ? ?Surgery: Cataract extraction with intraocular lens implant phacoemulsification; Right Eye ? ?Surgeon: Baruch Goldmann MD ?Surgery Date:  02-15-22 ?Pre-Op Date:  02-11-22 ? ?HPI: ?A 54 Yr. old female patient The patient is returning for a cataract evaluation. Both eyes are affected. Since the last visit, the affected area is worsening. The patient's vision is blurry. The complaint is associated with difficulty reading small print on medicine bottles/labels, difficulty recognizing people at a distance, and experiencing glare on bright sunny days. Patient is not taking medications. This is negatively affecting the patient's quality of life and the patient is unable to function adequately in life with the current level of vision. HPI was performed by Baruch Goldmann . ? ?Medical History: ?Cataracts ?Macula Degeneration ?Glaucoma ?Arthritis ?Diabetes - DM Type 2 ?High Blood Pressure ?LDL ?Lung Problems ?Seasonal Allergies ?Thyroid Problems ? ?Review of Systems ?Negative Allergic/Immunologic ?Negative Cardiovascular ?Negative Constitutional ?Negative Ear, Nose, Mouth & Throat ?Negative Endocrine ?Negative Eyes ?Negative Gastrointestinal ?Negative Genitourinary ?Negative Hemotologic/Lymphatic ?Negative Integumentary ?Negative Musculoskeletal ?Negative Neurological ?Negative Psychiatry ?Negative Respiratory ? ?Social ?  Former smoker  ? ?Medication ?Metoprolol, Metformin, Atorvastatin, Levothyroxine Sodium, Lisinopril, Xeljanz, Multivitamin, Prednisone, Albuterol,  ? ?Sx/Procedures ?Lumpectomy (benign), Carpal Tunnel, Lung sx, Lung Surgery (benign nodule),  ? ?Drug Allergies  ?Hydrocodone,  ? ?History & Physical: ?Heent: Cataract, Right Eye ?NECK: supple without bruits ?LUNGS: lungs clear to auscultation ?CV: regular rate and rhythm ?Abdomen: soft and non-tender ? ?Impression & Plan: ?Assessment: ?1.  COMBINED FORMS AGE RELATED CATARACT; Both  Eyes (H25.813) ?2.  Diabetes Type 2 No retinopathy (E11.9) ?3.  BLEPHARITIS; Right Upper Lid, Right Lower Lid, Left Upper Lid, Left Lower Lid (H01.001, H01.002,H01.004,H01.005) ?4.  Epithelial Basement Membrane Dystrophy; Both Eyes [phone]) ?5.  Pinguecula; Both Eyes (H11.153) ?6.  DERMATOCHALASIS; Right Upper Lid, Left Upper Lid (H02.831, N98.921) ? ?Plan: 1.  Cataract accounts for the patient's decreased vision. This visual impairment is not correctable with a tolerable change in glasses or contact lenses. Cataract surgery with an implantation of a new lens should significantly improve the visual and functional status of the patient. Discussed all risks, benefits, alternatives, and potential complications. Discussed the procedures and recovery. Patient desires to have surgery. A-scan ordered and performed today for intra-ocular lens calculations. The surgery will be performed in order to improve vision for driving, reading, and for eye examinations. Recommend phacoemulsification with intra-ocular lens. Recommend Dextenza for post-operative pain and inflammation. ?Right Eye worse - first. ?Dilates poorly - shugacaine by protocol. ?Malyugin Ring. ?Omidira. ?Vision Blue in room. ? ?2.  Stressed importance of blood sugar and blood pressure control, and also yearly eye examinations. ?Discussed the need for ongoing proactive ocular exams and treatment, hopefully before visual symptoms develop. ? ?3.  Recommend regular lid cleaning. ? ?4.  Discussed condition with patient. ?Condition can cause corneal erosions and pain in the eyes ?ATs PRN ? ?5.  Observe; Artificial tears as needed for irritation. ? ?6.  Asymptomatic, recommend observation for now. Findings, prognosis and treatment options reviewed. ?

## 2022-02-15 ENCOUNTER — Ambulatory Visit (HOSPITAL_COMMUNITY)
Admission: RE | Admit: 2022-02-15 | Discharge: 2022-02-15 | Disposition: A | Discharge status: Home / Self Care | Payer: Medicare Other | Attending: Ophthalmology | Admitting: Ophthalmology

## 2022-02-15 ENCOUNTER — Ambulatory Visit (HOSPITAL_COMMUNITY): Payer: Medicare Other | Admitting: Anesthesiology

## 2022-02-15 ENCOUNTER — Encounter (HOSPITAL_COMMUNITY)
Admission: RE | Disposition: A | Discharge status: Home / Self Care | Payer: Self-pay | Source: Home / Self Care | Attending: Ophthalmology

## 2022-02-15 ENCOUNTER — Ambulatory Visit (HOSPITAL_BASED_OUTPATIENT_CLINIC_OR_DEPARTMENT_OTHER): Payer: Medicare Other | Admitting: Anesthesiology

## 2022-02-15 ENCOUNTER — Encounter (HOSPITAL_COMMUNITY): Payer: Self-pay | Admitting: Ophthalmology

## 2022-02-15 DIAGNOSIS — J449 Chronic obstructive pulmonary disease, unspecified: Secondary | ICD-10-CM

## 2022-02-15 DIAGNOSIS — H25811 Combined forms of age-related cataract, right eye: Secondary | ICD-10-CM | POA: Insufficient documentation

## 2022-02-15 DIAGNOSIS — H0100A Unspecified blepharitis right eye, upper and lower eyelids: Secondary | ICD-10-CM | POA: Insufficient documentation

## 2022-02-15 DIAGNOSIS — E039 Hypothyroidism, unspecified: Secondary | ICD-10-CM | POA: Diagnosis not present

## 2022-02-15 DIAGNOSIS — I10 Essential (primary) hypertension: Secondary | ICD-10-CM | POA: Diagnosis not present

## 2022-02-15 DIAGNOSIS — Z87891 Personal history of nicotine dependence: Secondary | ICD-10-CM | POA: Diagnosis not present

## 2022-02-15 DIAGNOSIS — H0100B Unspecified blepharitis left eye, upper and lower eyelids: Secondary | ICD-10-CM | POA: Insufficient documentation

## 2022-02-15 DIAGNOSIS — E1136 Type 2 diabetes mellitus with diabetic cataract: Secondary | ICD-10-CM | POA: Insufficient documentation

## 2022-02-15 HISTORY — PX: CATARACT EXTRACTION W/PHACO: SHX586

## 2022-02-15 LAB — GLUCOSE, CAPILLARY: Glucose-Capillary: 121 mg/dL — ABNORMAL HIGH (ref 70–99)

## 2022-02-15 SURGERY — PHACOEMULSIFICATION, CATARACT, WITH IOL INSERTION
Anesthesia: Monitor Anesthesia Care | Site: Eye | Laterality: Right

## 2022-02-15 MED ORDER — LIDOCAINE HCL (PF) 1 % IJ SOLN
INTRAOCULAR | Status: DC | PRN
  Administered 2022-02-15: 1 mL via OPHTHALMIC

## 2022-02-15 MED ORDER — STERILE WATER FOR IRRIGATION IR SOLN
Status: DC | PRN
  Administered 2022-02-15: 250 mL

## 2022-02-15 MED ORDER — PHENYLEPHRINE-KETOROLAC 1-0.3 % IO SOLN
INTRAOCULAR | Status: AC
  Filled 2022-02-15: qty 4

## 2022-02-15 MED ORDER — LIDOCAINE HCL 3.5 % OP GEL
1.0000 "application " | Freq: Once | OPHTHALMIC | Status: AC
  Administered 2022-02-15: 1 via OPHTHALMIC

## 2022-02-15 MED ORDER — PHENYLEPHRINE HCL 2.5 % OP SOLN
1.0000 [drp] | OPHTHALMIC | Status: AC | PRN
  Administered 2022-02-15 (×3): 1 [drp] via OPHTHALMIC

## 2022-02-15 MED ORDER — TROPICAMIDE 1 % OP SOLN
1.0000 [drp] | OPHTHALMIC | Status: AC | PRN
  Administered 2022-02-15 (×3): 1 [drp] via OPHTHALMIC

## 2022-02-15 MED ORDER — SIGHTPATH DOSE#1 NA HYALUR & NA CHOND-NA HYALUR IO KIT
PACK | INTRAOCULAR | Status: DC | PRN
  Administered 2022-02-15: 1 via OPHTHALMIC

## 2022-02-15 MED ORDER — POVIDONE-IODINE 5 % OP SOLN
OPHTHALMIC | Status: DC | PRN
  Administered 2022-02-15: 1 via OPHTHALMIC

## 2022-02-15 MED ORDER — EPINEPHRINE PF 1 MG/ML IJ SOLN
INTRAOCULAR | Status: DC | PRN
  Administered 2022-02-15: 500 mL

## 2022-02-15 MED ORDER — TETRACAINE HCL 0.5 % OP SOLN
1.0000 [drp] | OPHTHALMIC | Status: AC | PRN
  Administered 2022-02-15 (×3): 1 [drp] via OPHTHALMIC

## 2022-02-15 MED ORDER — NEOMYCIN-POLYMYXIN-DEXAMETH 3.5-10000-0.1 OP SUSP
OPHTHALMIC | Status: DC | PRN
  Administered 2022-02-15: 1 [drp] via OPHTHALMIC

## 2022-02-15 MED ORDER — BSS IO SOLN
INTRAOCULAR | Status: DC | PRN
  Administered 2022-02-15: 15 mL via INTRAOCULAR

## 2022-02-15 MED ORDER — TRYPAN BLUE 0.06 % IO SOSY
PREFILLED_SYRINGE | INTRAOCULAR | Status: AC
  Filled 2022-02-15: qty 0.5

## 2022-02-15 SURGICAL SUPPLY — 16 items
CATARACT SUITE SIGHTPATH (MISCELLANEOUS) ×2 IMPLANT
CLOTH BEACON ORANGE TIMEOUT ST (SAFETY) ×2 IMPLANT
EYE SHIELD UNIVERSAL CLEAR (GAUZE/BANDAGES/DRESSINGS) ×1 IMPLANT
FEE CATARACT SUITE SIGHTPATH (MISCELLANEOUS) ×1 IMPLANT
GLOVE BIOGEL PI IND STRL 7.0 (GLOVE) ×2 IMPLANT
GLOVE BIOGEL PI INDICATOR 7.0 (GLOVE) ×2
LENS IOL RAYNER 17.0 (Intraocular Lens) ×2 IMPLANT
LENS IOL RAYONE EMV 17.0 (Intraocular Lens) IMPLANT
NDL HYPO 18GX1.5 BLUNT FILL (NEEDLE) ×1 IMPLANT
NEEDLE HYPO 18GX1.5 BLUNT FILL (NEEDLE) ×2 IMPLANT
PAD ARMBOARD 7.5X6 YLW CONV (MISCELLANEOUS) ×2 IMPLANT
RING MALYGIN 7.0 (MISCELLANEOUS) IMPLANT
SYR TB 1ML LL NO SAFETY (SYRINGE) ×2 IMPLANT
TAPE SURG TRANSPORE 1 IN (GAUZE/BANDAGES/DRESSINGS) IMPLANT
TAPE SURGICAL TRANSPORE 1 IN (GAUZE/BANDAGES/DRESSINGS) ×1
WATER STERILE IRR 250ML POUR (IV SOLUTION) ×2 IMPLANT

## 2022-02-15 NOTE — Interval H&P Note (Signed)
History and Physical Interval Note: ? ?02/15/2022 ?8:22 AM ? ?[name]  has presented today for surgery, with the diagnosis of combined forms age related cataract; right.  The various methods of treatment have been discussed with the patient and family. After consideration of risks, benefits and other options for treatment, the patient has consented to  Procedure(s) with comments: ?CATARACT EXTRACTION PHACO AND INTRAOCULAR LENS PLACEMENT (IOC) (Right) - right as a surgical intervention.  The patient's history has been reviewed, patient examined, no change in status, stable for surgery.  I have reviewed the patient's chart and labs.  Questions were answered to the patient's satisfaction.   ? ? ?Baruch Goldmann ? ? ?

## 2022-02-15 NOTE — Anesthesia Postprocedure Evaluation (Signed)
Anesthesia Post Note ? ?Patient: [name] ? ?Procedure(s) Performed: CATARACT EXTRACTION PHACO AND INTRAOCULAR LENS PLACEMENT (IOC) (Right: Eye) ? ?Patient location during evaluation: Short Stay ?Anesthesia Type: MAC ?Level of consciousness: awake and alert ?Pain management: pain level controlled ?Vital Signs Assessment: post-procedure vital signs reviewed and stable ?Respiratory status: spontaneous breathing ?Cardiovascular status: blood pressure returned to baseline ?Postop Assessment: no apparent nausea or vomiting ?Anesthetic complications: no ? ? ?No notable events documented. ? ? ?Last Vitals:  ?Vitals:  ? 02/15/22 0732  ?BP: (!) 145/59  ?Resp: 16  ?Temp: 36.9 ?C  ?SpO2: 97%  ?  ?Last Pain:  ?Vitals:  ? 02/15/22 0732  ?TempSrc: Oral  ?PainSc: 0-No pain  ? ? ?  ?  ?  ?  ?  ?  ? ?[name] ? ? ? ? ?

## 2022-02-15 NOTE — Op Note (Signed)
Date of procedure: 02/15/22 ? ?Pre-operative diagnosis:  Visually significant combined form age-related cataract, Right Eye (H25.811) ? ?Post-operative diagnosis:  Visually significant combined form age-related cataract, Right Eye (H25.811) ? ?Procedure: Removal of cataract via phacoemulsification and insertion of intra-ocular lens Rayner RAO200E +17.0D into the capsular bag of the Right Eye ? ?Attending surgeon: Gerda Diss. Marisa Hua, MD, MA ? ?Anesthesia: MAC, Topical Akten ? ?Complications: None ? ?Estimated Blood Loss: <21m (minimal) ? ?Specimens: None ? ?Implants: As above ? ?Indications:  Visually significant age-related cataract, Right Eye ? ?Procedure:  ?The patient was seen and identified in the pre-operative area. The operative eye was identified and dilated.  The operative eye was marked.  Topical anesthesia was administered to the operative eye.    ? ?The patient was then to the operative suite and placed in the supine position.  A timeout was performed confirming the patient, procedure to be performed, and all other relevant information.   The patient's face was prepped and draped in the usual fashion for intra-ocular surgery.  A lid speculum was placed into the operative eye and the surgical microscope moved into place and focused.  A superotemporal paracentesis was created using a 20 gauge paracentesis blade.  Shugarcaine was injected into the anterior chamber.  Viscoelastic was injected into the anterior chamber.  A temporal clear-corneal main wound incision was created using a 2.411mmicrokeratome.  A continuous curvilinear capsulorrhexis was initiated using an irrigating cystitome and completed using capsulorrhexis forceps.  Hydrodissection and hydrodeliniation were performed.  Viscoelastic was injected into the anterior chamber.  A phacoemulsification handpiece and a chopper as a second instrument were used to remove the nucleus and epinucleus. The irrigation/aspiration handpiece was used to remove any  remaining cortical material.  ? ?The capsular bag was reinflated with viscoelastic, checked, and found to be intact.  The intraocular lens was inserted into the capsular bag.  The irrigation/aspiration handpiece was used to remove any remaining viscoelastic.  The clear corneal wound and paracentesis wounds were then hydrated and checked with Weck-Cels to be watertight.  The lid-speculum was removed.  The drape was removed.  The patient's face was cleaned with a wet and dry 4x4.   Maxitrol was instilled in the eye. A clear shield was taped over the eye. The patient was taken to the post-operative care unit in good condition, having tolerated the procedure well. ? ?Post-Op Instructions: [facility] and will receive all other orders and instructions. ? ?

## 2022-02-15 NOTE — Anesthesia Preprocedure Evaluation (Signed)
Anesthesia Evaluation  ?Patient identified by MRN, date of birth, ID band ?Patient awake ? ? ? ?Reviewed: ?Allergy & Precautions, H&P , NPO status , Patient's Chart, lab work & pertinent test results, reviewed documented beta blocker date and time  ? ?Airway ?Mallampati: II ? ?TM Distance: >3 FB ?Neck ROM: full ? ? ? Dental ?no notable dental hx. ? ?  ?Pulmonary ?COPD, former smoker,  ?  ?Pulmonary exam normal ?breath sounds clear to auscultation ? ? ? ? ? ? Cardiovascular ?Exercise Tolerance: Good ?hypertension, negative cardio ROS ? ? ?Rhythm:regular Rate:Normal ? ? ?  ?Neuro/Psych ?negative neurological ROS ? negative psych ROS  ? GI/Hepatic ?negative GI ROS, Neg liver ROS,   ?Endo/Other  ?Hypothyroidism  ? Renal/GU ?negative Renal ROS  ?negative genitourinary ?  ?Musculoskeletal ? ? Abdominal ?  ?Peds ? Hematology ?negative hematology ROS ?(+)   ?Anesthesia Other Findings ? ? Reproductive/Obstetrics ?negative OB ROS ? ?  ? ? ? ? ? ? ? ? ? ? ? ? ? ?  ?  ? ? ? ? ? ? ? ? ?Anesthesia Physical ?Anesthesia Plan ? ?ASA: 2 ? ?Anesthesia Plan: MAC  ? ?Post-op Pain Management:   ? ?Induction:  ? ?PONV Risk Score and Plan:  ? ?Airway Management Planned:  ? ?Additional Equipment:  ? ?Intra-op Plan:  ? ?Post-operative Plan:  ? ?Informed Consent: I have reviewed the patients History and Physical, chart, labs and discussed the procedure including the risks, benefits and alternatives for the proposed anesthesia with the patient or authorized representative who has indicated his/her understanding and acceptance.  ? ? ? ?Dental Advisory Given ? ?Plan Discussed with: CRNA ? ?Anesthesia Plan Comments:   ? ? ? ? ? ? ?Anesthesia Quick Evaluation ? ?

## 2022-02-15 NOTE — Transfer of Care (Signed)
Immediate Anesthesia Transfer of Care Note ? ?Patient: [name] ? ?Procedure(s) Performed: CATARACT EXTRACTION PHACO AND INTRAOCULAR LENS PLACEMENT (IOC) (Right: Eye) ? ?Patient Location: Short Stay ? ?Anesthesia Type:MAC ? ?Level of Consciousness: awake ? ?Airway & Oxygen Therapy: Patient Spontanous Breathing ? ?Post-op Assessment: Report given to RN ? ?Post vital signs: Reviewed and stable ? ?Last Vitals:  ?Vitals Value Taken Time  ?BP    ?Temp    ?Pulse    ?Resp    ?SpO2    ? ? ?Last Pain:  ?Vitals:  ? 02/15/22 0732  ?TempSrc: Oral  ?PainSc: 0-No pain  ?   ? ?Patients Stated Pain Goal: 7 (02/15/22 0732) ? ?Complications: No notable events documented. ?

## 2022-02-15 NOTE — Discharge Instructions (Addendum)
Please discharge patient when stable, will follow up today with Dr. Wrzosek at the [facility] [place] office immediately following discharge.  Leave shield in place until visit.  All paperwork with discharge instructions will be given at the office.  [facility] [place] Address:  [address]  [place], [place] [address]  

## 2022-02-18 ENCOUNTER — Encounter (HOSPITAL_COMMUNITY): Payer: Self-pay | Admitting: Ophthalmology

## 2022-02-21 DIAGNOSIS — H25812 Combined forms of age-related cataract, left eye: Secondary | ICD-10-CM | POA: Diagnosis not present

## 2022-02-22 ENCOUNTER — Encounter (HOSPITAL_COMMUNITY): Payer: Self-pay

## 2022-02-22 ENCOUNTER — Encounter (HOSPITAL_COMMUNITY)
Admission: RE | Admit: 2022-02-22 | Discharge: 2022-02-22 | Disposition: A | Discharge status: Home / Self Care | Payer: Medicare Other | Source: Ambulatory Visit | Attending: Ophthalmology | Admitting: Ophthalmology

## 2022-02-22 ENCOUNTER — Other Ambulatory Visit: Payer: Self-pay

## 2022-02-22 NOTE — H&P (Signed)
Surgical History & Physical ? ?Patient Name: [name] DOB: Mar 29, 1956 ? ?Surgery: Cataract extraction with intraocular lens implant phacoemulsification; Left Eye ? ?Surgeon: Baruch Goldmann MD ?Surgery Date:  03-01-22 ?Pre-Op Date:  02-21-22 ? ?HPI: ?A 15 Yr. old female patient 1. The patient is returning after cataract surgery. The right eye is affected. Status post cataract surgery, which began 1 weeks ago: Since the last visit, the affected area is doing well. The patient's vision is improved. Patient is following medication instructions. 2. 2. The patient is returning for a cataract follow-up of the left eye. Since the last visit, the affected area is worsening. The patient's vision is blurry. The complaint is associated with light sensitivity. Patient reports difficulty seeing due to glare on bright sunny days. This is negatively affecting the patient's quality of life and the patient is unable to function adequately in life with the current level of vision. ? ?Medical History: ?Cataracts ?Macula Degeneration ?Glaucoma ?Arthritis ?Diabetes - DM Type 2 ?High Blood Pressure ?LDL ?Lung Problems ?Seasonal Allergies ?Thyroid Problems ? ?Review of Systems ?Negative Allergic/Immunologic ?Negative Cardiovascular ?Negative Constitutional ?Negative Ear, Nose, Mouth & Throat ?Negative Endocrine ?Negative Eyes ?Negative Gastrointestinal ?Negative Genitourinary ?Negative Hemotologic/Lymphatic ?Negative Integumentary ?Negative Musculoskeletal ?Negative Neurological ?Negative Psychiatry ?Negative Respiratory ? ?Social ?  Former smoker  ? ?Medication ?Prednisolone-Moxifloxacin-Bromfenac,  ?Metoprolol, Metformin, Atorvastatin, Levothyroxine Sodium, Lisinopril, Xeljanz, Multivitamin, Prednisone, Albuterol,  ? ?Sx/Procedures ?Phaco c IOL OD,  ?Lumpectomy (benign), Carpal Tunnel, Lung sx, Lung Surgery (benign nodule),  ? ?Drug Allergies  ?Hydrocodone,  ? ?History & Physical: ?Heent: Cataract, Left Eye ?NECK: supple without  bruits ?LUNGS: lungs clear to auscultation ?CV: regular rate and rhythm ?Abdomen: soft and non-tender ?Impression & Plan: ?Assessment: ?1.  CATARACT EXTRACTION STATUS; Right Eye (Z98.41) ?2.  COMBINED FORMS AGE RELATED CATARACT; Left Eye [phone] ? ?Plan: 1.  1 week after cataract surgery. Doing well with improved vision and normal eye pressure. Call with any problems or concerns. ?Continue Pred-Moxi-Brom 2x/day for 3 more weeks. ? ?2.  Cataract accounts for the patient's decreased vision. This visual impairment is not correctable with a tolerable change in glasses or contact lenses. Cataract surgery with an implantation of a new lens should significantly improve the visual and functional status of the patient. Discussed all risks, benefits, alternatives, and potential complications. Discussed the procedures and recovery. Patient desires to have surgery. A-scan ordered and performed today for intra-ocular lens calculations. The surgery will be performed in order to improve vision for driving, reading, and for eye examinations. Recommend phacoemulsification with intra-ocular lens. Recommend Dextenza for post-operative pain and inflammation. ?Left Eye. ?Surgery required to correct imbalance of vision. ?Dilates well - shugarcaine by protocol. ?Vision Blue in room.Marland Kitchen ?

## 2022-03-01 ENCOUNTER — Other Ambulatory Visit: Payer: Self-pay

## 2022-03-01 ENCOUNTER — Ambulatory Visit (HOSPITAL_COMMUNITY)
Admission: RE | Admit: 2022-03-01 | Discharge: 2022-03-01 | Disposition: A | Discharge status: Home / Self Care | Payer: Medicare Other | Attending: Ophthalmology | Admitting: Ophthalmology

## 2022-03-01 ENCOUNTER — Encounter (HOSPITAL_COMMUNITY): Payer: Self-pay | Admitting: Ophthalmology

## 2022-03-01 ENCOUNTER — Encounter (HOSPITAL_COMMUNITY)
Admission: RE | Disposition: A | Discharge status: Home / Self Care | Payer: Self-pay | Source: Home / Self Care | Attending: Ophthalmology

## 2022-03-01 ENCOUNTER — Ambulatory Visit (HOSPITAL_BASED_OUTPATIENT_CLINIC_OR_DEPARTMENT_OTHER): Payer: Medicare Other | Admitting: Anesthesiology

## 2022-03-01 ENCOUNTER — Ambulatory Visit (HOSPITAL_COMMUNITY): Payer: Medicare Other | Admitting: Anesthesiology

## 2022-03-01 DIAGNOSIS — Z7984 Long term (current) use of oral hypoglycemic drugs: Secondary | ICD-10-CM | POA: Insufficient documentation

## 2022-03-01 DIAGNOSIS — I10 Essential (primary) hypertension: Secondary | ICD-10-CM | POA: Diagnosis not present

## 2022-03-01 DIAGNOSIS — J449 Chronic obstructive pulmonary disease, unspecified: Secondary | ICD-10-CM | POA: Diagnosis not present

## 2022-03-01 DIAGNOSIS — E1136 Type 2 diabetes mellitus with diabetic cataract: Secondary | ICD-10-CM | POA: Insufficient documentation

## 2022-03-01 DIAGNOSIS — H25812 Combined forms of age-related cataract, left eye: Secondary | ICD-10-CM | POA: Insufficient documentation

## 2022-03-01 DIAGNOSIS — E039 Hypothyroidism, unspecified: Secondary | ICD-10-CM | POA: Diagnosis not present

## 2022-03-01 DIAGNOSIS — Z87891 Personal history of nicotine dependence: Secondary | ICD-10-CM | POA: Diagnosis not present

## 2022-03-01 HISTORY — PX: CATARACT EXTRACTION W/PHACO: SHX586

## 2022-03-01 LAB — GLUCOSE, CAPILLARY: Glucose-Capillary: 108 mg/dL — ABNORMAL HIGH (ref 70–99)

## 2022-03-01 SURGERY — PHACOEMULSIFICATION, CATARACT, WITH IOL INSERTION
Anesthesia: Monitor Anesthesia Care | Site: Eye | Laterality: Left

## 2022-03-01 MED ORDER — TRYPAN BLUE 0.06 % IO SOSY
PREFILLED_SYRINGE | INTRAOCULAR | Status: AC
  Filled 2022-03-01: qty 0.5

## 2022-03-01 MED ORDER — NEOMYCIN-POLYMYXIN-DEXAMETH 3.5-10000-0.1 OP SUSP
OPHTHALMIC | Status: DC | PRN
  Administered 2022-03-01: 2 [drp] via OPHTHALMIC

## 2022-03-01 MED ORDER — SODIUM HYALURONATE 23MG/ML IO SOSY
PREFILLED_SYRINGE | INTRAOCULAR | Status: DC | PRN
  Administered 2022-03-01: 0.6 mL via INTRAOCULAR

## 2022-03-01 MED ORDER — FENTANYL CITRATE (PF) 100 MCG/2ML IJ SOLN
INTRAMUSCULAR | Status: DC | PRN
  Administered 2022-03-01: 50 ug via INTRAVENOUS

## 2022-03-01 MED ORDER — SODIUM CHLORIDE 0.9% FLUSH
INTRAVENOUS | Status: DC | PRN
  Administered 2022-03-01: 5 mL via INTRAVENOUS

## 2022-03-01 MED ORDER — EPINEPHRINE PF 1 MG/ML IJ SOLN
INTRAMUSCULAR | Status: DC | PRN
  Administered 2022-03-01: 500 mL

## 2022-03-01 MED ORDER — MIDAZOLAM HCL 5 MG/5ML IJ SOLN
INTRAMUSCULAR | Status: DC | PRN
  Administered 2022-03-01: 1 mg via INTRAVENOUS

## 2022-03-01 MED ORDER — LIDOCAINE HCL (PF) 1 % IJ SOLN
INTRAOCULAR | Status: DC | PRN
  Administered 2022-03-01: 1 mL via OPHTHALMIC

## 2022-03-01 MED ORDER — TETRACAINE HCL 0.5 % OP SOLN
1.0000 [drp] | OPHTHALMIC | Status: AC | PRN
  Administered 2022-03-01 (×3): 1 [drp] via OPHTHALMIC

## 2022-03-01 MED ORDER — STERILE WATER FOR IRRIGATION IR SOLN
Status: DC | PRN
  Administered 2022-03-01: 250 mL

## 2022-03-01 MED ORDER — POVIDONE-IODINE 5 % OP SOLN
OPHTHALMIC | Status: DC | PRN
Start: 2022-03-01 — End: 2022-03-01
  Administered 2022-03-01: 1 via OPHTHALMIC

## 2022-03-01 MED ORDER — MIDAZOLAM HCL 2 MG/2ML IJ SOLN
INTRAMUSCULAR | Status: AC
  Filled 2022-03-01: qty 2

## 2022-03-01 MED ORDER — LIDOCAINE HCL 3.5 % OP GEL
1.0000 "application " | Freq: Once | OPHTHALMIC | Status: AC
  Administered 2022-03-01: 1 via OPHTHALMIC

## 2022-03-01 MED ORDER — EPINEPHRINE PF 1 MG/ML IJ SOLN
INTRAMUSCULAR | Status: AC
  Filled 2022-03-01: qty 1

## 2022-03-01 MED ORDER — FENTANYL CITRATE (PF) 100 MCG/2ML IJ SOLN
INTRAMUSCULAR | Status: AC
Start: 2022-03-01 — End: ?
  Filled 2022-03-01: qty 2

## 2022-03-01 MED ORDER — SODIUM HYALURONATE 10 MG/ML IO SOLUTION
PREFILLED_SYRINGE | INTRAOCULAR | Status: DC | PRN
  Administered 2022-03-01: 0.85 mL via INTRAOCULAR

## 2022-03-01 MED ORDER — PHENYLEPHRINE HCL 2.5 % OP SOLN
1.0000 [drp] | OPHTHALMIC | Status: AC | PRN
  Administered 2022-03-01 (×3): 1 [drp] via OPHTHALMIC

## 2022-03-01 MED ORDER — BSS IO SOLN
INTRAOCULAR | Status: DC | PRN
  Administered 2022-03-01: 15 mL via INTRAOCULAR

## 2022-03-01 MED ORDER — PHENYLEPHRINE-KETOROLAC 1-0.3 % IO SOLN
INTRAOCULAR | Status: AC
  Filled 2022-03-01: qty 4

## 2022-03-01 MED ORDER — TROPICAMIDE 1 % OP SOLN
1.0000 [drp] | OPHTHALMIC | Status: AC | PRN
  Administered 2022-03-01 (×3): 1 [drp] via OPHTHALMIC

## 2022-03-01 SURGICAL SUPPLY — 17 items
CATARACT SUITE SIGHTPATH (MISCELLANEOUS) ×2 IMPLANT
CLOTH BEACON ORANGE TIMEOUT ST (SAFETY) ×2 IMPLANT
EYE SHIELD UNIVERSAL CLEAR (GAUZE/BANDAGES/DRESSINGS) ×1 IMPLANT
FEE CATARACT SUITE SIGHTPATH (MISCELLANEOUS) ×1 IMPLANT
GLOVE BIOGEL PI IND STRL 7.0 (GLOVE) ×2 IMPLANT
GLOVE BIOGEL PI INDICATOR 7.0 (GLOVE) ×3
GOWN STRL REUS W/TWL LRG LVL3 (GOWN DISPOSABLE) ×1 IMPLANT
LENS IOL RAYNER 16.5 (Intraocular Lens) ×2 IMPLANT
LENS IOL RAYONE EMV 16.5 (Intraocular Lens) IMPLANT
NDL HYPO 18GX1.5 BLUNT FILL (NEEDLE) ×1 IMPLANT
NEEDLE HYPO 18GX1.5 BLUNT FILL (NEEDLE) ×2 IMPLANT
PAD ARMBOARD 7.5X6 YLW CONV (MISCELLANEOUS) ×2 IMPLANT
RING MALYGIN 7.0 (MISCELLANEOUS) IMPLANT
SYR TB 1ML LL NO SAFETY (SYRINGE) ×2 IMPLANT
TAPE SURG TRANSPORE 1 IN (GAUZE/BANDAGES/DRESSINGS) IMPLANT
TAPE SURGICAL TRANSPORE 1 IN (GAUZE/BANDAGES/DRESSINGS) ×1
WATER STERILE IRR 250ML POUR (IV SOLUTION) ×2 IMPLANT

## 2022-03-01 NOTE — Op Note (Signed)
Date of procedure: 03/01/22 ? ?Pre-operative diagnosis: Visually significant age-related combined cataract, Left Eye (H25.812) ? ?Post-operative diagnosis: Visually significant age-related combined cataract, Left Eye (H25.812) ? ?Procedure: Removal of cataract via phacoemulsification and insertion of intra-ocular lens Rayner RAO200E +16.5D into the capsular bag of the Left Eye ? ?Attending surgeon: Gerda Diss. Marisa Hua, MD, MA ? ?Anesthesia: MAC, Topical Akten ? ?Complications: None ? ?Estimated Blood Loss: <1m (minimal) ? ?Specimens: None ? ?Implants: As above ? ?Indications:  Visually significant age-related cataract, Left Eye ? ?Procedure:  ?The patient was seen and identified in the pre-operative area. The operative eye was identified and dilated.  The operative eye was marked.  Topical anesthesia was administered to the operative eye.    ? ?The patient was then to the operative suite and placed in the supine position.  A timeout was performed confirming the patient, procedure to be performed, and all other relevant information.   The patient's face was prepped and draped in the usual fashion for intra-ocular surgery.  A lid speculum was placed into the operative eye and the surgical microscope moved into place and focused.  An inferotemporal paracentesis was created using a 20 gauge paracentesis blade.  Shugarcaine was injected into the anterior chamber.  Viscoelastic was injected into the anterior chamber.  A temporal clear-corneal main wound incision was created using a 2.4109mmicrokeratome.  A continuous curvilinear capsulorrhexis was initiated using an irrigating cystitome and completed using capsulorrhexis forceps.  Hydrodissection and hydrodeliniation were performed.  Viscoelastic was injected into the anterior chamber.  A phacoemulsification handpiece and a chopper as a second instrument were used to remove the nucleus and epinucleus. The irrigation/aspiration handpiece was used to remove any remaining  cortical material.  ? ?The capsular bag was reinflated with viscoelastic, checked, and found to be intact.  The intraocular lens was inserted into the capsular bag.  The irrigation/aspiration handpiece was used to remove any remaining viscoelastic.  The clear corneal wound and paracentesis wounds were then hydrated and checked with Weck-Cels to be watertight.  Maxitrol was instilled in the eye. The lid-speculum was removed.  The drape was removed.  The patient's face was cleaned with a wet and dry 4x4.    A clear shield was taped over the eye. The patient was taken to the post-operative care unit in good condition, having tolerated the procedure well. ? ?Post-Op Instructions: [facility] and will receive all other orders and instructions. ? ?

## 2022-03-01 NOTE — Anesthesia Preprocedure Evaluation (Signed)
Anesthesia Evaluation  ?Patient identified by MRN, date of birth, ID band ?Patient awake ? ? ? ?Reviewed: ?Allergy & Precautions, NPO status , Patient's Chart, lab work & pertinent test results, reviewed documented beta blocker date and time  ? ?History of Anesthesia Complications ?Negative for: history of anesthetic complications ? ?Airway ?Mallampati: II ? ?TM Distance: >3 FB ?Neck ROM: Full ? ? ? Dental ? ?(+) Dental Advisory Given, Upper Dentures ?  ?Pulmonary ?COPD, former smoker,  ?  ?Pulmonary exam normal ?breath sounds clear to auscultation ? ? ? ? ? ? Cardiovascular ?Exercise Tolerance: Good ?hypertension, Pt. on medications and Pt. on home beta blockers ?Normal cardiovascular exam ?Rhythm:Regular Rate:Normal ? ? ?  ?Neuro/Psych ?negative neurological ROS ? negative psych ROS  ? GI/Hepatic ?negative GI ROS, Neg liver ROS,   ?Endo/Other  ?diabetes (pre), Well Controlled, Type 2, Oral Hypoglycemic AgentsHypothyroidism  ? Renal/GU ?negative Renal ROS  ? ?  ?Musculoskeletal ? ?(+) Arthritis , Osteoarthritis,   ? Abdominal ?  ?Peds ? Hematology ?  ?Anesthesia Other Findings ? ? Reproductive/Obstetrics ?negative OB ROS ? ?  ? ? ? ? ? ? ? ? ? ? ? ? ? ?  ?  ? ? ? ? ? ? ? ? ?Anesthesia Physical ?Anesthesia Plan ? ?ASA: 2 ? ?Anesthesia Plan: MAC  ? ?Post-op Pain Management: Minimal or no pain anticipated  ? ?Induction:  ? ?PONV Risk Score and Plan:  ? ?Airway Management Planned: Nasal Cannula and Natural Airway ? ?Additional Equipment:  ? ?Intra-op Plan:  ? ?Post-operative Plan:  ? ?Informed Consent: I have reviewed the patients History and Physical, chart, labs and discussed the procedure including the risks, benefits and alternatives for the proposed anesthesia with the patient or authorized representative who has indicated his/her understanding and acceptance.  ? ? ? ?Dental advisory given ? ?Plan Discussed with: CRNA and Surgeon ? ?Anesthesia Plan Comments:    ? ? ? ? ? ? ?Anesthesia Quick Evaluation ? ?

## 2022-03-01 NOTE — Interval H&P Note (Signed)
History and Physical Interval Note: ? ?03/01/2022 ?11:17 AM ? ?[name]  has presented today for surgery, with the diagnosis of combined forms age related cataract; left.  The various methods of treatment have been discussed with the patient and family. After consideration of risks, benefits and other options for treatment, the patient has consented to  Procedure(s) with comments: ?CATARACT EXTRACTION PHACO AND INTRAOCULAR LENS PLACEMENT (IOC) (Left) - CDE: as a surgical intervention.  The patient's history has been reviewed, patient examined, no change in status, stable for surgery.  I have reviewed the patient's chart and labs.  Questions were answered to the patient's satisfaction.   ? ? ?Baruch Goldmann ? ? ?

## 2022-03-01 NOTE — Discharge Instructions (Addendum)
Please discharge patient when stable, will follow up today with Dr. Wrzosek at the [facility] office immediately following discharge.  Leave shield in place until visit.  All paperwork with discharge instructions will be given at the office.  [facility] [place] Address:  [address]  [place], [place] [address]  

## 2022-03-01 NOTE — Transfer of Care (Signed)
Immediate Anesthesia Transfer of Care Note ? ?Patient: [name] ? ?Procedure(s) Performed: CATARACT EXTRACTION PHACO AND INTRAOCULAR LENS PLACEMENT (IOC) (Left: Eye) ? ?Patient Location: Short Stay ? ?Anesthesia Type:MAC ? ?Level of Consciousness: awake, alert  and oriented ? ?Airway & Oxygen Therapy: Patient Spontanous Breathing ? ?Post-op Assessment: Report given to RN and Post -op Vital signs reviewed and stable ? ?Post vital signs: Reviewed and stable ? ?Last Vitals:  ?Vitals Value Taken Time  ?BP    ?Temp    ?Pulse    ?Resp    ?SpO2    ? ? ?Last Pain:  ?Vitals:  ? 03/01/22 1028  ?TempSrc: Oral  ?PainSc: 0-No pain  ?   ? ?  ? ?Complications: No notable events documented. ?

## 2022-03-01 NOTE — Anesthesia Postprocedure Evaluation (Signed)
Anesthesia Post Note ? ?Patient: [name] ? ?Procedure(s) Performed: CATARACT EXTRACTION PHACO AND INTRAOCULAR LENS PLACEMENT (IOC) (Left: Eye) ? ?Patient location during evaluation: Phase II ?Anesthesia Type: MAC ?Level of consciousness: awake and alert and oriented ?Pain management: pain level controlled ?Vital Signs Assessment: post-procedure vital signs reviewed and stable ?Respiratory status: spontaneous breathing, nonlabored ventilation and respiratory function stable ?Cardiovascular status: blood pressure returned to baseline and stable ?Postop Assessment: no apparent nausea or vomiting ?Anesthetic complications: no ? ? ?No notable events documented. ? ? ?Last Vitals:  ?Vitals:  ? 03/01/22 1028 03/01/22 1147  ?BP: (!) 146/74 121/63  ?Pulse: 60 66  ?Resp: 16 17  ?Temp: 36.7 ?C   ?SpO2: 100% 97%  ?  ?Last Pain:  ?Vitals:  ? 03/01/22 1147  ?TempSrc:   ?PainSc: 0-No pain  ? ? ?  ?  ?  ?  ?  ?  ? ?Winford Hehn C Dusan Lipford ? ? ? ? ?

## 2022-03-04 ENCOUNTER — Encounter (HOSPITAL_COMMUNITY): Payer: Self-pay | Admitting: Ophthalmology

## 2022-04-04 DIAGNOSIS — M06879 Other specified rheumatoid arthritis, unspecified ankle and foot: Secondary | ICD-10-CM | POA: Diagnosis not present

## 2022-04-04 DIAGNOSIS — J45909 Unspecified asthma, uncomplicated: Secondary | ICD-10-CM | POA: Diagnosis not present

## 2022-04-04 DIAGNOSIS — E038 Other specified hypothyroidism: Secondary | ICD-10-CM | POA: Diagnosis not present

## 2022-04-04 DIAGNOSIS — E7849 Other hyperlipidemia: Secondary | ICD-10-CM | POA: Diagnosis not present

## 2022-04-04 DIAGNOSIS — I10 Essential (primary) hypertension: Secondary | ICD-10-CM | POA: Diagnosis not present

## 2022-04-04 DIAGNOSIS — E1143 Type 2 diabetes mellitus with diabetic autonomic (poly)neuropathy: Secondary | ICD-10-CM | POA: Diagnosis not present

## 2022-04-04 DIAGNOSIS — J3081 Allergic rhinitis due to animal (cat) (dog) hair and dander: Secondary | ICD-10-CM | POA: Diagnosis not present

## 2022-04-17 DIAGNOSIS — M0579 Rheumatoid arthritis with rheumatoid factor of multiple sites without organ or systems involvement: Secondary | ICD-10-CM | POA: Diagnosis not present

## 2022-04-17 DIAGNOSIS — M1990 Unspecified osteoarthritis, unspecified site: Secondary | ICD-10-CM | POA: Diagnosis not present

## 2022-04-17 DIAGNOSIS — Z79899 Other long term (current) drug therapy: Secondary | ICD-10-CM | POA: Diagnosis not present

## 2022-04-17 DIAGNOSIS — E785 Hyperlipidemia, unspecified: Secondary | ICD-10-CM | POA: Diagnosis not present

## 2022-04-17 DIAGNOSIS — E039 Hypothyroidism, unspecified: Secondary | ICD-10-CM | POA: Diagnosis not present

## 2022-07-15 DIAGNOSIS — I10 Essential (primary) hypertension: Secondary | ICD-10-CM | POA: Diagnosis not present

## 2022-07-15 DIAGNOSIS — E1143 Type 2 diabetes mellitus with diabetic autonomic (poly)neuropathy: Secondary | ICD-10-CM | POA: Diagnosis not present

## 2022-07-15 DIAGNOSIS — J45909 Unspecified asthma, uncomplicated: Secondary | ICD-10-CM | POA: Diagnosis not present

## 2022-07-15 DIAGNOSIS — E038 Other specified hypothyroidism: Secondary | ICD-10-CM | POA: Diagnosis not present

## 2022-07-15 DIAGNOSIS — E7849 Other hyperlipidemia: Secondary | ICD-10-CM | POA: Diagnosis not present

## 2022-07-15 DIAGNOSIS — Z0000 Encounter for general adult medical examination without abnormal findings: Secondary | ICD-10-CM | POA: Diagnosis not present

## 2022-07-15 DIAGNOSIS — J3081 Allergic rhinitis due to animal (cat) (dog) hair and dander: Secondary | ICD-10-CM | POA: Diagnosis not present

## 2022-07-15 DIAGNOSIS — M06879 Other specified rheumatoid arthritis, unspecified ankle and foot: Secondary | ICD-10-CM | POA: Diagnosis not present

## 2022-07-23 DIAGNOSIS — Z1231 Encounter for screening mammogram for malignant neoplasm of breast: Secondary | ICD-10-CM | POA: Diagnosis not present

## 2022-10-09 DIAGNOSIS — M0579 Rheumatoid arthritis with rheumatoid factor of multiple sites without organ or systems involvement: Secondary | ICD-10-CM | POA: Diagnosis not present

## 2022-10-09 DIAGNOSIS — M1990 Unspecified osteoarthritis, unspecified site: Secondary | ICD-10-CM | POA: Diagnosis not present

## 2022-10-09 DIAGNOSIS — E785 Hyperlipidemia, unspecified: Secondary | ICD-10-CM | POA: Diagnosis not present

## 2022-10-09 DIAGNOSIS — E039 Hypothyroidism, unspecified: Secondary | ICD-10-CM | POA: Diagnosis not present

## 2022-10-09 DIAGNOSIS — Z79899 Other long term (current) drug therapy: Secondary | ICD-10-CM | POA: Diagnosis not present

## 2022-10-23 DIAGNOSIS — E038 Other specified hypothyroidism: Secondary | ICD-10-CM | POA: Diagnosis not present

## 2022-10-23 DIAGNOSIS — E7849 Other hyperlipidemia: Secondary | ICD-10-CM | POA: Diagnosis not present

## 2022-10-23 DIAGNOSIS — J3081 Allergic rhinitis due to animal (cat) (dog) hair and dander: Secondary | ICD-10-CM | POA: Diagnosis not present

## 2022-10-23 DIAGNOSIS — E1143 Type 2 diabetes mellitus with diabetic autonomic (poly)neuropathy: Secondary | ICD-10-CM | POA: Diagnosis not present

## 2022-10-23 DIAGNOSIS — M06879 Other specified rheumatoid arthritis, unspecified ankle and foot: Secondary | ICD-10-CM | POA: Diagnosis not present

## 2022-10-23 DIAGNOSIS — J45909 Unspecified asthma, uncomplicated: Secondary | ICD-10-CM | POA: Diagnosis not present

## 2022-10-23 DIAGNOSIS — I10 Essential (primary) hypertension: Secondary | ICD-10-CM | POA: Diagnosis not present

## 2022-12-09 DIAGNOSIS — J0191 Acute recurrent sinusitis, unspecified: Secondary | ICD-10-CM | POA: Diagnosis not present

## 2023-01-21 ENCOUNTER — Other Ambulatory Visit (HOSPITAL_COMMUNITY): Payer: Self-pay | Admitting: Internal Medicine

## 2023-01-21 DIAGNOSIS — M06879 Other specified rheumatoid arthritis, unspecified ankle and foot: Secondary | ICD-10-CM | POA: Diagnosis not present

## 2023-01-21 DIAGNOSIS — J0191 Acute recurrent sinusitis, unspecified: Secondary | ICD-10-CM | POA: Diagnosis not present

## 2023-01-21 DIAGNOSIS — E038 Other specified hypothyroidism: Secondary | ICD-10-CM | POA: Diagnosis not present

## 2023-01-21 DIAGNOSIS — M810 Age-related osteoporosis without current pathological fracture: Secondary | ICD-10-CM

## 2023-01-21 DIAGNOSIS — E1143 Type 2 diabetes mellitus with diabetic autonomic (poly)neuropathy: Secondary | ICD-10-CM | POA: Diagnosis not present

## 2023-01-21 DIAGNOSIS — I10 Essential (primary) hypertension: Secondary | ICD-10-CM | POA: Diagnosis not present

## 2023-01-21 DIAGNOSIS — E7849 Other hyperlipidemia: Secondary | ICD-10-CM | POA: Diagnosis not present

## 2023-01-21 DIAGNOSIS — J45909 Unspecified asthma, uncomplicated: Secondary | ICD-10-CM | POA: Diagnosis not present

## 2023-01-21 DIAGNOSIS — J309 Allergic rhinitis, unspecified: Secondary | ICD-10-CM | POA: Diagnosis not present

## 2023-01-21 DIAGNOSIS — Z0000 Encounter for general adult medical examination without abnormal findings: Secondary | ICD-10-CM | POA: Diagnosis not present

## 2023-02-04 ENCOUNTER — Other Ambulatory Visit (HOSPITAL_COMMUNITY): Payer: Medicare Other

## 2023-02-07 ENCOUNTER — Other Ambulatory Visit (HOSPITAL_COMMUNITY): Payer: Medicare Other

## 2023-02-19 ENCOUNTER — Ambulatory Visit (HOSPITAL_COMMUNITY)
Admission: RE | Admit: 2023-02-19 | Discharge: 2023-02-19 | Disposition: A | Discharge status: Home / Self Care | Payer: Medicare Other | Source: Ambulatory Visit | Attending: Internal Medicine | Admitting: Internal Medicine

## 2023-02-19 DIAGNOSIS — Z780 Asymptomatic menopausal state: Secondary | ICD-10-CM | POA: Diagnosis not present

## 2023-02-19 DIAGNOSIS — M810 Age-related osteoporosis without current pathological fracture: Secondary | ICD-10-CM | POA: Diagnosis not present

## 2023-02-19 DIAGNOSIS — M85851 Other specified disorders of bone density and structure, right thigh: Secondary | ICD-10-CM | POA: Diagnosis not present

## 2023-04-22 DIAGNOSIS — J309 Allergic rhinitis, unspecified: Secondary | ICD-10-CM | POA: Diagnosis not present

## 2023-04-22 DIAGNOSIS — Z0000 Encounter for general adult medical examination without abnormal findings: Secondary | ICD-10-CM | POA: Diagnosis not present

## 2023-04-22 DIAGNOSIS — E1143 Type 2 diabetes mellitus with diabetic autonomic (poly)neuropathy: Secondary | ICD-10-CM | POA: Diagnosis not present

## 2023-04-22 DIAGNOSIS — J45909 Unspecified asthma, uncomplicated: Secondary | ICD-10-CM | POA: Diagnosis not present

## 2023-04-22 DIAGNOSIS — E7849 Other hyperlipidemia: Secondary | ICD-10-CM | POA: Diagnosis not present

## 2023-04-22 DIAGNOSIS — E038 Other specified hypothyroidism: Secondary | ICD-10-CM | POA: Diagnosis not present

## 2023-04-22 DIAGNOSIS — M06879 Other specified rheumatoid arthritis, unspecified ankle and foot: Secondary | ICD-10-CM | POA: Diagnosis not present

## 2023-04-22 DIAGNOSIS — I10 Essential (primary) hypertension: Secondary | ICD-10-CM | POA: Diagnosis not present

## 2023-05-29 DIAGNOSIS — M1990 Unspecified osteoarthritis, unspecified site: Secondary | ICD-10-CM | POA: Diagnosis not present

## 2023-05-29 DIAGNOSIS — Z79899 Other long term (current) drug therapy: Secondary | ICD-10-CM | POA: Diagnosis not present

## 2023-05-29 DIAGNOSIS — E039 Hypothyroidism, unspecified: Secondary | ICD-10-CM | POA: Diagnosis not present

## 2023-05-29 DIAGNOSIS — M0579 Rheumatoid arthritis with rheumatoid factor of multiple sites without organ or systems involvement: Secondary | ICD-10-CM | POA: Diagnosis not present

## 2023-05-29 DIAGNOSIS — E785 Hyperlipidemia, unspecified: Secondary | ICD-10-CM | POA: Diagnosis not present

## 2023-07-29 DIAGNOSIS — J309 Allergic rhinitis, unspecified: Secondary | ICD-10-CM | POA: Diagnosis not present

## 2023-07-29 DIAGNOSIS — M06879 Other specified rheumatoid arthritis, unspecified ankle and foot: Secondary | ICD-10-CM | POA: Diagnosis not present

## 2023-07-29 DIAGNOSIS — J45909 Unspecified asthma, uncomplicated: Secondary | ICD-10-CM | POA: Diagnosis not present

## 2023-07-29 DIAGNOSIS — E1143 Type 2 diabetes mellitus with diabetic autonomic (poly)neuropathy: Secondary | ICD-10-CM | POA: Diagnosis not present

## 2023-07-29 DIAGNOSIS — Z0000 Encounter for general adult medical examination without abnormal findings: Secondary | ICD-10-CM | POA: Diagnosis not present

## 2023-07-29 DIAGNOSIS — E038 Other specified hypothyroidism: Secondary | ICD-10-CM | POA: Diagnosis not present

## 2023-07-29 DIAGNOSIS — I10 Essential (primary) hypertension: Secondary | ICD-10-CM | POA: Diagnosis not present

## 2023-07-29 DIAGNOSIS — E7849 Other hyperlipidemia: Secondary | ICD-10-CM | POA: Diagnosis not present

## 2023-11-24 DIAGNOSIS — J309 Allergic rhinitis, unspecified: Secondary | ICD-10-CM | POA: Diagnosis not present

## 2023-11-24 DIAGNOSIS — J45909 Unspecified asthma, uncomplicated: Secondary | ICD-10-CM | POA: Diagnosis not present

## 2023-11-24 DIAGNOSIS — M06879 Other specified rheumatoid arthritis, unspecified ankle and foot: Secondary | ICD-10-CM | POA: Diagnosis not present

## 2023-11-24 DIAGNOSIS — E038 Other specified hypothyroidism: Secondary | ICD-10-CM | POA: Diagnosis not present

## 2023-11-24 DIAGNOSIS — I10 Essential (primary) hypertension: Secondary | ICD-10-CM | POA: Diagnosis not present

## 2023-11-24 DIAGNOSIS — Z0000 Encounter for general adult medical examination without abnormal findings: Secondary | ICD-10-CM | POA: Diagnosis not present

## 2023-11-24 DIAGNOSIS — E7849 Other hyperlipidemia: Secondary | ICD-10-CM | POA: Diagnosis not present

## 2023-11-24 DIAGNOSIS — E1122 Type 2 diabetes mellitus with diabetic chronic kidney disease: Secondary | ICD-10-CM | POA: Diagnosis not present

## 2023-11-24 DIAGNOSIS — N182 Chronic kidney disease, stage 2 (mild): Secondary | ICD-10-CM | POA: Diagnosis not present

## 2023-12-03 DIAGNOSIS — M79642 Pain in left hand: Secondary | ICD-10-CM | POA: Diagnosis not present

## 2023-12-03 DIAGNOSIS — Z23 Encounter for immunization: Secondary | ICD-10-CM | POA: Diagnosis not present

## 2023-12-03 DIAGNOSIS — Z79899 Other long term (current) drug therapy: Secondary | ICD-10-CM | POA: Diagnosis not present

## 2023-12-03 DIAGNOSIS — M79641 Pain in right hand: Secondary | ICD-10-CM | POA: Diagnosis not present

## 2023-12-03 DIAGNOSIS — M25532 Pain in left wrist: Secondary | ICD-10-CM | POA: Diagnosis not present

## 2023-12-03 DIAGNOSIS — M1990 Unspecified osteoarthritis, unspecified site: Secondary | ICD-10-CM | POA: Diagnosis not present

## 2023-12-03 DIAGNOSIS — M0579 Rheumatoid arthritis with rheumatoid factor of multiple sites without organ or systems involvement: Secondary | ICD-10-CM | POA: Diagnosis not present

## 2023-12-03 DIAGNOSIS — E785 Hyperlipidemia, unspecified: Secondary | ICD-10-CM | POA: Diagnosis not present

## 2023-12-03 DIAGNOSIS — E039 Hypothyroidism, unspecified: Secondary | ICD-10-CM | POA: Diagnosis not present

## 2024-02-23 DIAGNOSIS — N182 Chronic kidney disease, stage 2 (mild): Secondary | ICD-10-CM | POA: Diagnosis not present

## 2024-02-23 DIAGNOSIS — I10 Essential (primary) hypertension: Secondary | ICD-10-CM | POA: Diagnosis not present

## 2024-02-23 DIAGNOSIS — E038 Other specified hypothyroidism: Secondary | ICD-10-CM | POA: Diagnosis not present

## 2024-02-23 DIAGNOSIS — M06879 Other specified rheumatoid arthritis, unspecified ankle and foot: Secondary | ICD-10-CM | POA: Diagnosis not present

## 2024-02-23 DIAGNOSIS — E7849 Other hyperlipidemia: Secondary | ICD-10-CM | POA: Diagnosis not present

## 2024-02-23 DIAGNOSIS — E1122 Type 2 diabetes mellitus with diabetic chronic kidney disease: Secondary | ICD-10-CM | POA: Diagnosis not present

## 2024-02-23 DIAGNOSIS — J45909 Unspecified asthma, uncomplicated: Secondary | ICD-10-CM | POA: Diagnosis not present

## 2024-02-23 DIAGNOSIS — J309 Allergic rhinitis, unspecified: Secondary | ICD-10-CM | POA: Diagnosis not present

## 2024-02-27 DIAGNOSIS — E1122 Type 2 diabetes mellitus with diabetic chronic kidney disease: Secondary | ICD-10-CM | POA: Diagnosis not present

## 2024-02-27 DIAGNOSIS — E038 Other specified hypothyroidism: Secondary | ICD-10-CM | POA: Diagnosis not present

## 2024-02-27 DIAGNOSIS — J45909 Unspecified asthma, uncomplicated: Secondary | ICD-10-CM | POA: Diagnosis not present

## 2024-02-27 DIAGNOSIS — M06879 Other specified rheumatoid arthritis, unspecified ankle and foot: Secondary | ICD-10-CM | POA: Diagnosis not present

## 2024-02-27 DIAGNOSIS — N182 Chronic kidney disease, stage 2 (mild): Secondary | ICD-10-CM | POA: Diagnosis not present

## 2024-05-25 DIAGNOSIS — I10 Essential (primary) hypertension: Secondary | ICD-10-CM | POA: Diagnosis not present

## 2024-05-25 DIAGNOSIS — J309 Allergic rhinitis, unspecified: Secondary | ICD-10-CM | POA: Diagnosis not present

## 2024-05-25 DIAGNOSIS — Z0000 Encounter for general adult medical examination without abnormal findings: Secondary | ICD-10-CM | POA: Diagnosis not present

## 2024-05-25 DIAGNOSIS — E1122 Type 2 diabetes mellitus with diabetic chronic kidney disease: Secondary | ICD-10-CM | POA: Diagnosis not present

## 2024-05-25 DIAGNOSIS — N182 Chronic kidney disease, stage 2 (mild): Secondary | ICD-10-CM | POA: Diagnosis not present

## 2024-05-25 DIAGNOSIS — J45909 Unspecified asthma, uncomplicated: Secondary | ICD-10-CM | POA: Diagnosis not present

## 2024-05-25 DIAGNOSIS — E7849 Other hyperlipidemia: Secondary | ICD-10-CM | POA: Diagnosis not present

## 2024-05-25 DIAGNOSIS — E038 Other specified hypothyroidism: Secondary | ICD-10-CM | POA: Diagnosis not present

## 2024-05-25 DIAGNOSIS — M06879 Other specified rheumatoid arthritis, unspecified ankle and foot: Secondary | ICD-10-CM | POA: Diagnosis not present

## 2024-05-26 DIAGNOSIS — E119 Type 2 diabetes mellitus without complications: Secondary | ICD-10-CM | POA: Diagnosis not present

## 2024-07-07 DIAGNOSIS — M1990 Unspecified osteoarthritis, unspecified site: Secondary | ICD-10-CM | POA: Diagnosis not present

## 2024-07-07 DIAGNOSIS — M0579 Rheumatoid arthritis with rheumatoid factor of multiple sites without organ or systems involvement: Secondary | ICD-10-CM | POA: Diagnosis not present

## 2024-07-07 DIAGNOSIS — Z79899 Other long term (current) drug therapy: Secondary | ICD-10-CM | POA: Diagnosis not present

## 2024-07-07 DIAGNOSIS — Z23 Encounter for immunization: Secondary | ICD-10-CM | POA: Diagnosis not present

## 2024-08-04 NOTE — Progress Notes (Signed)
 [name]                                          MRN: [id number]   08/04/2024   The [facility] Quality Team Specialist reviewed this patient medical record for the purposes of chart review for care gap closure. The following were reviewed: chart review for care gap closure-kidney health evaluation for diabetes:eGFR  and uACR.    [facility] Quality Team

## 2024-11-03 ENCOUNTER — Other Ambulatory Visit (HOSPITAL_COMMUNITY): Payer: Self-pay | Admitting: Internal Medicine

## 2024-11-03 DIAGNOSIS — Z1231 Encounter for screening mammogram for malignant neoplasm of breast: Secondary | ICD-10-CM

## 2024-12-01 ENCOUNTER — Ambulatory Visit (HOSPITAL_COMMUNITY)

## 2024-12-17 ENCOUNTER — Ambulatory Visit (HOSPITAL_COMMUNITY)
